# Patient Record
Sex: Male | Born: 1950 | Race: White | Hispanic: No | Smoking: Never smoker
Health system: Southern US, Community
[De-identification: ages and names within clinical notes are randomized; demographics above are authoritative.]

## PROBLEM LIST (undated history)

## (undated) DIAGNOSIS — C801 Malignant (primary) neoplasm, unspecified: Secondary | ICD-10-CM

## (undated) DIAGNOSIS — T7840XA Allergy, unspecified, initial encounter: Secondary | ICD-10-CM

## (undated) DIAGNOSIS — K219 Gastro-esophageal reflux disease without esophagitis: Secondary | ICD-10-CM

## (undated) HISTORY — PX: BASAL CELL CARCINOMA EXCISION: SHX1214

## (undated) HISTORY — DX: Malignant (primary) neoplasm, unspecified: C80.1

## (undated) HISTORY — PX: SQUAMOUS CELL CARCINOMA EXCISION: SHX2433

## (undated) HISTORY — PX: HERNIA REPAIR: SHX51

## (undated) HISTORY — DX: Gastro-esophageal reflux disease without esophagitis: K21.9

## (undated) HISTORY — PX: PTERYGIUM EXCISION: SHX2273

## (undated) HISTORY — PX: FRACTURE SURGERY: SHX138

## (undated) HISTORY — DX: Allergy, unspecified, initial encounter: T78.40XA

## (undated) HISTORY — PX: NO PAST SURGERIES: SHX2092

---

## 2018-05-14 ENCOUNTER — Ambulatory Visit: Payer: Self-pay | Admitting: Urology

## 2018-08-08 ENCOUNTER — Other Ambulatory Visit: Payer: Self-pay

## 2018-08-08 DIAGNOSIS — R972 Elevated prostate specific antigen [PSA]: Secondary | ICD-10-CM

## 2018-08-09 ENCOUNTER — Other Ambulatory Visit: Payer: Self-pay

## 2018-08-09 ENCOUNTER — Other Ambulatory Visit: Payer: Medicare Other

## 2018-08-09 DIAGNOSIS — R972 Elevated prostate specific antigen [PSA]: Secondary | ICD-10-CM

## 2018-08-10 LAB — PSA: Prostate Specific Ag, Serum: 4.3 ng/mL — ABNORMAL HIGH (ref 0.0–4.0)

## 2018-08-13 ENCOUNTER — Other Ambulatory Visit: Payer: Self-pay

## 2018-08-13 ENCOUNTER — Encounter: Payer: Self-pay | Admitting: Urology

## 2018-08-13 ENCOUNTER — Ambulatory Visit: Payer: Medicare Other | Admitting: Urology

## 2018-08-13 VITALS — BP 141/77 | HR 71 | Ht 73.0 in | Wt 232.0 lb

## 2018-08-13 DIAGNOSIS — R972 Elevated prostate specific antigen [PSA]: Secondary | ICD-10-CM | POA: Diagnosis not present

## 2018-08-13 NOTE — Progress Notes (Signed)
08/13/2018 8:49 PM   Juan West 09-04-1950 [id number]  Referring provider: Alvera Singh, [address] [place],  [place] [address]  Chief Complaint  Patient presents with  . Elevated PSA    HPI: 68 year old male referred for evaluation of elevated/rising PSA.  He underwent routine annual PSA screening on 04/04/2018 which time his PSA was noted to be 3.48.  This was up from the previous year on 02/09/2017 at which time his PSA was 2.56.  Overall, his PSA is relatively stable, 3.6 from 10/2013.  His PSA was repeated just prior to this appointment in continues to rise 4.3.   He denies any significant urinary symptoms.  He occasionally has some dysuria after holding it for a long time when traveling but this is rare and infrequent.  No family history of prostate cancer.  He reports that his father had prostate issues and is elderly years but unrelated to malignancy.  No weight loss or bone pain.  No history of urologic evaluation in the past.  PMH: History reviewed. No pertinent past medical history.  Surgical History: Past Surgical History:  Procedure Laterality Date  . NO PAST SURGERIES      Home Medications:  Allergies as of 08/13/2018      Reactions   Codeine Other (See Comments)   hallucinations      Medication List       Accurate as of August 13, 2018  8:49 PM. If you have any questions, ask your nurse or doctor.        aspirin EC 81 MG tablet Take by mouth.   Biotin 1 MG Caps Take by mouth.       Allergies:  Allergies  Allergen Reactions  . Codeine Other (See Comments)    hallucinations    Family History: History reviewed. No pertinent family history.  Social History:  reports that he has never smoked. He has never used smokeless tobacco. He reports that he does not drink alcohol. No history on file for drug.  ROS: UROLOGY Frequent Urination?: No Hard to postpone urination?: No Burning/pain with  urination?: No Get up at night to urinate?: No Leakage of urine?: No Urine stream starts and stops?: No Trouble starting stream?: No Do you have to strain to urinate?: No Blood in urine?: No Urinary tract infection?: No Sexually transmitted disease?: No Injury to kidneys or bladder?: No Painful intercourse?: No Weak stream?: No Erection problems?: No Penile pain?: No  Gastrointestinal Nausea?: No Vomiting?: No Indigestion/heartburn?: No Diarrhea?: No Constipation?: No  Constitutional Fever: No Night sweats?: No Weight loss?: No Fatigue?: No  Skin Skin rash/lesions?: No Itching?: No  Eyes Blurred vision?: No Double vision?: No  Ears/Nose/Throat Sore throat?: No Sinus problems?: No  Hematologic/Lymphatic Swollen glands?: No Easy bruising?: No  Cardiovascular Leg swelling?: No Chest pain?: No  Respiratory Cough?: No Shortness of breath?: No  Endocrine Excessive thirst?: No  Musculoskeletal Back pain?: No Joint pain?: No  Neurological Headaches?: No Dizziness?: No  Psychologic Depression?: No Anxiety?: No  Physical Exam: BP (!) 141/77   Pulse 71   Ht 6\' 1"  (1.854 m)   Wt 232 lb (105.2 kg)   BMI 30.61 kg/m   Constitutional:  Alert and oriented, No acute distress. HEENT:  AT, moist mucus membranes.  Trachea midline, no masses. Cardiovascular: No clubbing, cyanosis, or edema. Respiratory: Normal respiratory effort, no increased work of breathing. GI: Abdomen is soft, nontender, nondistended, no abdominal masses Rectal: Normal sphincter tone.  45 cc prostate, nontender, no nodules. Skin: No rashes, bruises or suspicious lesions. Neurologic: Grossly intact, no focal deficits, moving all 4 extremities. Psychiatric: Normal mood and affect.  Laboratory Data: Creatinine 1.1 on 03/2018, baseline appears to range from 1-1.3.   Assessment & Plan:    1. Elevated PSA  We reviewed the implications of an elevated PSA and the uncertainty  surrounding it. In general, a man's PSA increases with age and is produced by both normal and cancerous prostate tissue. Differential for elevated PSA is BPH, prostate cancer, infection, recent intercourse/ejaculation, prostate infarction, recent urethroscopic manipulation (foley placement/cystoscopy) and prostatitis. Management of an elevated PSA can include observation or prostate biopsy and wediscussed this in detail. We discussed that indications for prostate biopsy are defined by age and race specific PSA cutoffs as well as a PSA velocity of 0.75/year.  We discussed that if his overall trend and rising is somewhat worrisome.  That being said, his PSA has been as high as 3 3.65 years ago so his overall baseline is not significantly changed.  He was offered various options today including prostate biopsy, prostate MRI and continued close surveillance.  He is elected to proceed close surveillance.  He will follow-up in 3 months with repeat PSA.  If his PSA continues to rise, will more strongly push for the previous options.  He is agreeable this plan.  - PSA; Future   Return in 3 months (on 11/13/2018) for will call with PSA results and plan, MD follow up.  Hollice Espy, MD  [facility] [address], [place], [place] [address] [phone]

## 2018-11-12 ENCOUNTER — Other Ambulatory Visit: Payer: Medicare Other

## 2018-11-12 ENCOUNTER — Encounter: Payer: Self-pay | Admitting: Urology

## 2018-11-14 ENCOUNTER — Ambulatory Visit (INDEPENDENT_AMBULATORY_CARE_PROVIDER_SITE_OTHER): Payer: Medicare Other | Admitting: Urology

## 2018-11-14 ENCOUNTER — Encounter: Payer: Self-pay | Admitting: Urology

## 2018-11-14 ENCOUNTER — Other Ambulatory Visit: Payer: Self-pay

## 2018-11-14 DIAGNOSIS — R972 Elevated prostate specific antigen [PSA]: Secondary | ICD-10-CM

## 2018-11-14 NOTE — Progress Notes (Signed)
Patient arrived for clinic appointment today but did not have his PSA drawn prior to this appointment.  As such, his PSA was drawn and will have a follow-up tomorrow via virtual visit.  Hollice Espy, MD

## 2018-11-15 ENCOUNTER — Telehealth (INDEPENDENT_AMBULATORY_CARE_PROVIDER_SITE_OTHER): Payer: Medicare Other | Admitting: Urology

## 2018-11-15 DIAGNOSIS — R972 Elevated prostate specific antigen [PSA]: Secondary | ICD-10-CM

## 2018-11-15 LAB — PSA: Prostate Specific Ag, Serum: 3.7 ng/mL (ref 0.0–4.0)

## 2018-11-15 NOTE — Progress Notes (Signed)
Virtual Visit via Telephone Note  I connected with Juan West on 11/15/18 at 11:30 AM EDT by telephone and verified that I am speaking with the correct person using two identifiers.  Location: Patient: Home Provider: Office [facility])   I discussed the limitations, risks, security and privacy concerns of performing an evaluation and management service by telephone and the availability of in person appointments. I also discussed with the patient that there may be a patient responsible charge related to this service. The patient expressed understanding and agreed to proceed.   History of Present Illness: 68 year old male with minimal medical comorbidities who was initially referred for elevated PSA.  The time, his PSA had risen to 4.3 up from his previous baseline in the mid threes.  Rectal exam was previously unremarkable in 07/2018 with a 45 g prostate, no nodules.  He has no symptoms.  Repeat PSA shows return of his PSA back to his previous baseline, 3.7.    Observations/Objective: Pleasant, interactive  Assessment and Plan:  1. Elevated PSA Personal history of some PSA fluctuation, most recent PSA back to his previous baseline  We discussed the PSA screening guidelines, age 30 or perhaps longer depending on the patient's comorbidities.  Given these reasonably healthy, would recommend continuation of screening at least until the age of 25 with annual PSA/DRE.  He may be discharged back to his primary care, refer back as needed.  All questions answered today.    I discussed the assessment and treatment plan with the patient. The patient was provided an opportunity to ask questions and all were answered. The patient agreed with the plan and demonstrated an understanding of the instructions.   The patient was advised to call back or seek an in-person evaluation if the symptoms worsen or if the condition fails to improve as anticipated.  I provided 6 minutes of  non-face-to-face time during this encounter.   Hollice Espy, MD

## 2020-05-19 LAB — BASIC METABOLIC PANEL
BUN: 19 (ref 4–21)
CO2: 22 (ref 13–22)
Chloride: 102 (ref 99–108)
Creatinine: 1.4 — AB (ref 0.6–1.3)
Glucose: 95
Potassium: 4.7 (ref 3.4–5.3)
Sodium: 143 (ref 137–147)

## 2020-05-19 LAB — HEPATIC FUNCTION PANEL
ALT: 29 (ref 10–40)
AST: 28 (ref 14–40)
Alkaline Phosphatase: 71 (ref 25–125)
Bilirubin, Total: 0.6

## 2020-05-19 LAB — COMPREHENSIVE METABOLIC PANEL
Albumin: 4.8 (ref 3.5–5.0)
Calcium: 9.4 (ref 8.7–10.7)
GFR calc non Af Amer: 56
Globulin: 2.4

## 2020-05-19 LAB — LIPID PANEL
Cholesterol: 144 (ref 0–200)
HDL: 32 — AB (ref 35–70)
LDL Cholesterol: 93
LDl/HDL Ratio: 2.9
Triglycerides: 102 (ref 40–160)

## 2020-05-25 ENCOUNTER — Other Ambulatory Visit: Payer: Self-pay | Admitting: Family Medicine

## 2020-05-25 DIAGNOSIS — Z823 Family history of stroke: Secondary | ICD-10-CM

## 2020-05-25 DIAGNOSIS — R030 Elevated blood-pressure reading, without diagnosis of hypertension: Secondary | ICD-10-CM

## 2020-07-05 ENCOUNTER — Ambulatory Visit
Admission: RE | Admit: 2020-07-05 | Discharge: 2020-07-05 | Disposition: A | Discharge status: Home / Self Care | Payer: Medicare Other | Source: Ambulatory Visit | Attending: Family Medicine | Admitting: Family Medicine

## 2020-07-05 ENCOUNTER — Other Ambulatory Visit: Payer: Self-pay

## 2020-07-05 DIAGNOSIS — I6522 Occlusion and stenosis of left carotid artery: Secondary | ICD-10-CM | POA: Diagnosis not present

## 2020-07-05 DIAGNOSIS — Z823 Family history of stroke: Secondary | ICD-10-CM | POA: Diagnosis not present

## 2020-07-05 DIAGNOSIS — R030 Elevated blood-pressure reading, without diagnosis of hypertension: Secondary | ICD-10-CM | POA: Diagnosis not present

## 2020-07-08 ENCOUNTER — Encounter: Payer: Self-pay | Admitting: Nurse Practitioner

## 2020-07-08 ENCOUNTER — Other Ambulatory Visit: Payer: Self-pay

## 2020-07-08 ENCOUNTER — Ambulatory Visit (INDEPENDENT_AMBULATORY_CARE_PROVIDER_SITE_OTHER): Payer: Medicare Other | Admitting: Nurse Practitioner

## 2020-07-08 VITALS — BP 141/83 | HR 77 | Temp 98.2°F | Ht 71.8 in | Wt 249.6 lb

## 2020-07-08 DIAGNOSIS — R0609 Other forms of dyspnea: Secondary | ICD-10-CM | POA: Insufficient documentation

## 2020-07-08 DIAGNOSIS — R0600 Dyspnea, unspecified: Secondary | ICD-10-CM | POA: Diagnosis not present

## 2020-07-08 DIAGNOSIS — R030 Elevated blood-pressure reading, without diagnosis of hypertension: Secondary | ICD-10-CM | POA: Diagnosis not present

## 2020-07-08 DIAGNOSIS — R7989 Other specified abnormal findings of blood chemistry: Secondary | ICD-10-CM | POA: Insufficient documentation

## 2020-07-08 DIAGNOSIS — K219 Gastro-esophageal reflux disease without esophagitis: Secondary | ICD-10-CM | POA: Diagnosis not present

## 2020-07-08 DIAGNOSIS — Z7689 Persons encountering health services in other specified circumstances: Secondary | ICD-10-CM

## 2020-07-08 NOTE — Assessment & Plan Note (Signed)
Chronic, stable. Controlled with omeprazole OTC. Continue current regimen. Follow up with any concerns.

## 2020-07-08 NOTE — Assessment & Plan Note (Signed)
Chronic, over the last 3 years, however getting progressively worse. He had an abnormal stress test in 2019, however his cardiac catheterization was normal. He had carotid dopplers which were normal. He said he had a breathing test which was normal, however he said he was not having to exert himself. Chest x-ray was normal in 2019. He used an albuterol inhaler at one point, however he did not notice any differences, so he stopped using it. Labs reviewed from May 19, 2020. Will order echo and refer to pulmonology. Follow-up in 3 months or sooner with concerns.

## 2020-07-08 NOTE — Assessment & Plan Note (Signed)
Blood pressure slightly elevated today at 141/83. Discussed diet, exercise, and weight loss. He is currently active, however unable to exert too hard due to shortness of breath.

## 2020-07-08 NOTE — Progress Notes (Addendum)
New Patient Office Visit  Subjective:  Patient ID: Juan West, male    DOB: Dec 12, 1950  Age: 70 y.o. MRN: [id number]  CC:  Chief Complaint  Patient presents with   Establish Care    Pt states he would like to discuss SOB. States he has been getting very SOB lately.      HPI Juan West presents for new patient visit to establish care.  Introduced to Designer, jewellery role and practice setting.  All questions answered.  Discussed provider/patient relationship and expectations.  SHORTNESS OF BREATH  Duration: year,however progressively getting worse Onset: gradual Description of breathing discomfort: severe shortness of breath with activity Severity: severe Episode duration: 5 minutes to recover after activity Frequency: with exertion Related to exertion: yes Cough: no Chest tightness: yes Wheezing:  unsure Fevers: no Chest pain: no Palpitations: yes  Nausea: no Diaphoresis: no Deconditioning: no Status: worse Aggravating factors: exercise, bending over Alleviating factors: rest Treatments attempted: albuterol--didn't help much  He is normally an active person. He goes to [employer] walks 3.64mph on the treadmill with no issues. He has shortness of breath with more intense activity and bending over. He had a job recently where he had to climb 120 steps, and it took him over 30 minutes to do this with multiple breaks needed. Of note, he did have covid-19 a year ago.    Past Medical History:  Diagnosis Date   Allergy    Cancer ([place])    GERD (gastroesophageal reflux disease)     Past Surgical History:  Procedure Laterality Date   BASAL CELL CARCINOMA EXCISION     HERNIA REPAIR     NO PAST SURGERIES     PTERYGIUM EXCISION     SQUAMOUS CELL CARCINOMA EXCISION      Family History  Problem Relation Age of Onset   Stroke Mother    Cancer Father    Stroke Brother    Autism Granddaughter     Social History   Socioeconomic History   Marital  status: Divorced    Spouse name: Not on file   Number of children: Not on file   Years of education: Not on file   Highest education level: Not on file  Occupational History   Not on file  Tobacco Use   Smoking status: Never   Smokeless tobacco: Never  Vaping Use   Vaping Use: Never used  Substance and Sexual Activity   Alcohol use: Yes    Comment: on occasion   Drug use: Never   Sexual activity: Not Currently  Other Topics Concern   Not on file  Social History Narrative   Not on file   Social Determinants of Health   Financial Resource Strain: Not on file  Food Insecurity: Not on file  Transportation Needs: Not on file  Physical Activity: Not on file  Stress: Not on file  Social Connections: Not on file  Intimate Partner Violence: Not on file    ROS Review of Systems  Constitutional:  Positive for fatigue. Negative for fever.  HENT:  Positive for hearing loss (left ear).   Eyes: Negative.   Respiratory:  Positive for chest tightness and shortness of breath (with exertion). Negative for cough.   Cardiovascular: Negative.   Gastrointestinal: Negative.   Endocrine: Negative.   Genitourinary: Negative.   Musculoskeletal: Negative.   Skin: Negative.   Allergic/Immunologic: Positive for environmental allergies.  Neurological:  Positive for dizziness (intermittent). Negative for weakness and numbness.  Psychiatric/Behavioral:  Positive for sleep disturbance.    Objective:   Today's Vitals: BP (!) 141/83 (BP Location: Right Arm, Cuff Size: Normal)   Pulse 77   Temp 98.2 F (36.8 C) (Oral)   Ht 5' 11.8" (1.824 m)   Wt 249 lb 9.6 oz (113.2 kg)   SpO2 99%   BMI 34.04 kg/m   Physical Exam Vitals and nursing note reviewed.  Constitutional:      General: He is not in acute distress.    Appearance: Normal appearance. He is obese.  HENT:     Head: Normocephalic.  Eyes:     Conjunctiva/sclera: Conjunctivae normal.  Cardiovascular:     Rate and Rhythm: Normal  rate and regular rhythm.     Pulses: Normal pulses.     Heart sounds: Normal heart sounds.  Pulmonary:     Effort: Pulmonary effort is normal.     Breath sounds: Normal breath sounds.  Abdominal:     General: Bowel sounds are normal.     Palpations: Abdomen is soft.     Tenderness: no abdominal tenderness  Musculoskeletal:        General: Normal range of motion.     Cervical back: Normal range of motion.     Right lower leg: No edema.     Left lower leg: No edema.  Skin:    General: Skin is warm.  Neurological:     General: No focal deficit present.     Mental Status: He is alert and oriented to person, place, and time.  Psychiatric:        Mood and Affect: Mood normal.        Behavior: Behavior normal.        Thought Content: Thought content normal.        Judgment: Judgment normal.    Assessment & Plan:   Problem List Items Addressed This Visit       Digestive   Gastroesophageal reflux disease    Chronic, stable. Controlled with omeprazole OTC. Continue current regimen. Follow up with any concerns.       Relevant Medications   omeprazole (PRILOSEC) 10 MG capsule     Other   Dyspnea on exertion - Primary    Chronic, over the last 3 years, however getting progressively worse. He had an abnormal stress test in 2019, however his cardiac catheterization was normal. He had carotid dopplers which were normal. He said he had a breathing test which was normal, however he said he was not having to exert himself. Chest x-ray was normal in 2019. He used an albuterol inhaler at one point, however he did not notice any differences, so he stopped using it. Labs reviewed from May 19, 2020. Will order echo and refer to pulmonology. Follow-up in 3 months or sooner with concerns.        Relevant Orders   ECHOCARDIOGRAM COMPLETE   Ambulatory referral to Pulmonology   Elevated blood pressure reading    Blood pressure slightly elevated today at 141/83. Discussed diet, exercise, and  weight loss. He is currently active, however unable to exert too hard due to shortness of breath.        Elevated serum creatinine    Creatinine from April 2022 was slightly elevated at 1.4. Encouraged him to drink plenty of fluids. Will recheck at his next visit.        Other Visit Diagnoses     Encounter to establish care  Outpatient Encounter Medications as of 07/08/2020  Medication Sig   omeprazole (PRILOSEC) 10 MG capsule Take 10 mg by mouth daily.   [DISCONTINUED] aspirin EC 81 MG tablet Take by mouth.   [DISCONTINUED] Biotin 1 MG CAPS Take by mouth.   [DISCONTINUED] omeprazole (PRILOSEC) 20 MG capsule Take 1 capsule by mouth daily.   No facility-administered encounter medications on file as of 07/08/2020.    Follow-up: Return in about 3 months (around 10/08/2020) for shortness of breath.   Charyl Dancer, NP

## 2020-07-08 NOTE — Assessment & Plan Note (Signed)
Creatinine from April 2022 was slightly elevated at 1.4. Encouraged him to drink plenty of fluids. Will recheck at his next visit.

## 2020-07-28 ENCOUNTER — Ambulatory Visit
Admission: RE | Admit: 2020-07-28 | Discharge: 2020-07-28 | Disposition: A | Discharge status: Home / Self Care | Payer: Medicare Other | Source: Ambulatory Visit | Attending: Family Medicine | Admitting: Family Medicine

## 2020-07-28 DIAGNOSIS — R030 Elevated blood-pressure reading, without diagnosis of hypertension: Secondary | ICD-10-CM

## 2020-07-29 NOTE — Telephone Encounter (Signed)
Called [facility] they said they faxed over records on 6/22 they will fax again

## 2020-08-09 ENCOUNTER — Ambulatory Visit (INDEPENDENT_AMBULATORY_CARE_PROVIDER_SITE_OTHER): Payer: Medicare Other | Admitting: Internal Medicine

## 2020-08-09 ENCOUNTER — Ambulatory Visit
Admission: RE | Admit: 2020-08-09 | Discharge: 2020-08-09 | Disposition: A | Discharge status: Home / Self Care | Payer: Medicare Other | Attending: Internal Medicine | Admitting: Internal Medicine

## 2020-08-09 ENCOUNTER — Encounter: Payer: Self-pay | Admitting: Internal Medicine

## 2020-08-09 ENCOUNTER — Other Ambulatory Visit: Payer: Self-pay

## 2020-08-09 ENCOUNTER — Ambulatory Visit
Admission: RE | Admit: 2020-08-09 | Discharge: 2020-08-09 | Disposition: A | Discharge status: Home / Self Care | Payer: Medicare Other | Source: Ambulatory Visit | Attending: Internal Medicine | Admitting: Internal Medicine

## 2020-08-09 VITALS — BP 110/80 | HR 79 | Temp 97.9°F | Ht 72.0 in | Wt 248.4 lb

## 2020-08-09 DIAGNOSIS — T7589XA Other specified effects of external causes, initial encounter: Secondary | ICD-10-CM

## 2020-08-09 DIAGNOSIS — R0602 Shortness of breath: Secondary | ICD-10-CM

## 2020-08-09 NOTE — Patient Instructions (Signed)
Obtain CXR Obtain PFT's Obtain 6MWT and OVERNIGHT PULSE OXIMETRY

## 2020-08-09 NOTE — Progress Notes (Signed)
[facility] Consultation      Date: 08/09/2020,   MRN# [id number] Juan West [birth year]     Admission                  Current  Juan West is a 70 y.o. old male seen in consultation for SOB at the request of [place].     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 , paint, dye houses, warehouses for many years  Patient states that he had extensive cardiac work up 4 years ago to address SOB Patient has occasional wheezing witnessed by family  No evidence of heart failure at this time No evidence or signs of infection at this time No respiratory distress No fevers, chills, nausea, vomiting, diarrhea No evidence of lower extremity edema No evidence hemoptysis  Patient diagnosed with COVID infection last year sometime, diagnosed with a home test Was not hospitalized Breathing was not affected   I explained to patient that we need further testing to assess for breathing and lung issues  Patient weighed 230 pounds 10 years ago Patient now weighs approx 250 pounds 20 pound weight gain in 10 years  PAST MEDICAL HISTORY   Past Medical History:  Diagnosis Date   Allergy    Cancer ([place])    GERD (gastroesophageal reflux disease)      SURGICAL HISTORY   Past Surgical History:  Procedure Laterality Date   BASAL CELL CARCINOMA EXCISION     HERNIA REPAIR     NO PAST SURGERIES     PTERYGIUM EXCISION     SQUAMOUS CELL CARCINOMA EXCISION       FAMILY HISTORY   Family History  Problem Relation Age of Onset   Stroke Mother    Cancer Father    Stroke Brother    Autism Granddaughter      SOCIAL HISTORY   Social History   Tobacco Use   Smoking status: Never   Smokeless  tobacco: Never  Vaping Use   Vaping Use: Never used  Substance Use Topics   Alcohol use: Yes    Comment: on occasion   Drug use: Never     MEDICATIONS    Home Medication:  Current Outpatient Rx   Order #: [id number] Class: Historical Med    Current Medication:  Current Outpatient Medications:    omeprazole (PRILOSEC) 10 MG capsule, Take 10 mg by mouth daily., Disp: , Rfl:     ALLERGIES   Codeine, Melatonin, and Trazodone and nefazodone     REVIEW OF SYSTEMS    Review of Systems:  Gen:  Denies  fever, sweats, chills weight loss  HEENT: Denies blurred vision, double vision, ear pain, eye pain, hearing loss, nose bleeds, sore throat Cardiac:  No dizziness, chest pain or heaviness, chest tightness,edema, No JVD Resp:   No cough, -sputum production, +shortness of breath,+wheezing, -hemoptysis,  Gi: Denies swallowing difficulty, stomach pain, nausea or vomiting, diarrhea, constipation, bowel incontinence Gu:  Denies bladder incontinence, burning urine Ext:   Denies Joint pain, stiffness or swelling Skin: Denies  skin rash, easy bruising or bleeding or hives Endoc:  Denies polyuria, polydipsia , polyphagia or weight change Psych:   Denies depression, insomnia or  hallucinations  Other:  All other systems negative   Weight 248 11/80 98% RA HR 79  Physical Examination:   General Appearance: No distress  EYES PERRLA, EOM intact.   NECK Supple, No JVD Pulmonary: normal breath sounds, No wheezing.  CardiovascularNormal S1,S2.  No m/r/g.   Abdomen: Benign, Soft, non-tender. Skin:   warm, no rashes, no ecchymosis  Extremities: normal, no cyanosis, clubbing. Neuro:without focal findings,  speech normal  PSYCHIATRIC: Mood, affect within normal limits.   ALL OTHER ROS ARE NEGATIVE      IMAGING    US AORTA MEDICARE SCREENING  Result Date: 07/28/2020 CLINICAL DATA:  Male between 28-28 years of age with a smoking history. History of hypertension. EXAM: US ABDOMINAL  AORTA MEDICARE SCREENING TECHNIQUE: Ultrasound examination of the abdominal aorta was performed as a screening evaluation for abdominal aortic aneurysm. COMPARISON:  None. FINDINGS: Abdominal aortic measurements as follows: Proximal:  2.7 x 2.9 cm Mid:  2.1 x 2.2 cm Distal:  1.9 x 2.0 cm IMPRESSION: No evidence of abdominal aortic aneurysm. Electronically Signed   By: Aletta Edouard M.D.   On: 07/28/2020 12:16      ASSESSMENT/PLAN   70 yo WM with progressive SOB and increased WOB with occasional wheezing likely related to chronic environmental exposure from his work causing intermittent reactive airways disease previous diagnosis of COVID infection with obesity   I have explained that I will need further testing Will need PFTs and assess exertional and nocturnal hypoxia   Obesity -recommend significant weight loss -recommend changing diet  Deconditioned state -Recommend increased daily activity and exercise   MEDICATION ADJUSTMENTS/LABS AND TESTS ORDERED: Obtain CXR Obtain PFT's Obtain 6MWT and OVERNIGHT PULSE OXIMETRY   CURRENT MEDICATIONS REVIEWED AT LENGTH WITH PATIENT TODAY   Patient  satisfied with Plan of action and management. All questions answered  Follow up 3 months  Total Time Spent  55 mins   Corrin Parker, M.D.  [facility]  Medical Director [place] Director [facility] Cardio-P[facility] Department

## 2020-08-10 ENCOUNTER — Encounter: Payer: Self-pay | Admitting: Nurse Practitioner

## 2020-08-10 ENCOUNTER — Ambulatory Visit (INDEPENDENT_AMBULATORY_CARE_PROVIDER_SITE_OTHER): Payer: Medicare Other | Admitting: Nurse Practitioner

## 2020-08-10 VITALS — BP 149/85 | HR 74 | Temp 98.1°F | Wt 249.0 lb

## 2020-08-10 DIAGNOSIS — R0609 Other forms of dyspnea: Secondary | ICD-10-CM

## 2020-08-10 DIAGNOSIS — R0600 Dyspnea, unspecified: Secondary | ICD-10-CM

## 2020-08-10 DIAGNOSIS — R42 Dizziness and giddiness: Secondary | ICD-10-CM | POA: Diagnosis not present

## 2020-08-10 DIAGNOSIS — R030 Elevated blood-pressure reading, without diagnosis of hypertension: Secondary | ICD-10-CM | POA: Diagnosis not present

## 2020-08-10 NOTE — Assessment & Plan Note (Addendum)
With positive Epley maneuver and dizziness with changes in position, suspect benign positional vertigo. Encouraged him to drink fluids and giving exercises to help with vertigo. If symptoms still continue, can try vestibular PT and may need imaging. Check CMP and CBC today. Follow-up in 4 weeks or sooner if symptoms worsen.

## 2020-08-10 NOTE — Assessment & Plan Note (Signed)
Blood pressure elevated again at today's visit. He says he does get anxious at doctor appointments. Encouraged him to get a BP cuff and monitor his blood pressure at home daily and write down the results and bring to his next visit. Will follow-up in 4 weeks.

## 2020-08-10 NOTE — Patient Instructions (Signed)
Start checking your blood pressure once a day at home and write it down. Check it after you have been sitting for 10 minutes and not had any caffeine.

## 2020-08-10 NOTE — Assessment & Plan Note (Signed)
Chronic, ongoing. Reviewed pulmonologist's note. Will await test results and collaborate with care. Reviewed prior labs from last PCP. Will recheck CMP, CBC, and TSH.

## 2020-08-10 NOTE — Progress Notes (Signed)
Established Patient Office Visit  Subjective:  Patient ID: Juan West, male    DOB: [birth year]  Age: 70 y.o. MRN: [id number]  CC:  Chief Complaint  Patient presents with   Dizziness    Patient states he has been dizzy for a few days, comes and goes    Shortness of Breath    Patient states he is still having shortness of breath, states about the same as last time     HPI Juan West presents for dizziness and shortness of breath with exertion  DIZZINESS  Duration: over 1 weeks Description of symptoms: off kilter, feels like a wave comes over him and then fades away Duration of episode: seconds Dizziness frequency: no history of the same Provoking factors: none Aggravating factors:  none Triggered by rolling over in bed:  not sure Triggered by bending over: no Aggravated by head movement: no Aggravated by exertion, coughing, loud noises: no Recent head injury: no Recent or current viral symptoms: yes runny nose for 2 days 1 week before History of vasovagal episodes: no Nausea: no Vomiting: no Tinnitus: yes Hearing loss: yes left ear, chronic Aural fullness: no Headache: no Photophobia/phonophobia: no Unsteady gait: no Postural instability: no Diplopia, dysarthria, dysphagia or weakness: no Related to exertion: no Pallor: no Diaphoresis: yes Dyspnea: yes, chronic Chest pain: no  SHORTNESS OF BREATH  His symptoms are the same as they were last visit. He can do normal activity, walk on the treadmill with no shortness of breath. If he suddenly exerts himself or is carrying/pushing a heavy object, he gets short of breath easily. He saw pulmonology the other day, who ordered an x-ray, pulmonary function tests and a sleep study. He also stated that the pulmonologist said it could be from his weight and deconditioning. He denies chest pain.   Past Medical History:  Diagnosis Date   Allergy    Cancer ([place])    GERD (gastroesophageal reflux disease)      Past Surgical History:  Procedure Laterality Date   BASAL CELL CARCINOMA EXCISION     HERNIA REPAIR     NO PAST SURGERIES     PTERYGIUM EXCISION     SQUAMOUS CELL CARCINOMA EXCISION      Family History  Problem Relation Age of Onset   Stroke Mother    Cancer Father    Stroke Brother    Autism Granddaughter     Social History   Socioeconomic History   Marital status: Divorced    Spouse name: Not on file   Number of children: Not on file   Years of education: Not on file   Highest education level: Not on file  Occupational History   Not on file  Tobacco Use   Smoking status: Never   Smokeless tobacco: Never  Vaping Use   Vaping Use: Never used  Substance and Sexual Activity   Alcohol use: Yes    Comment: on occasion   Drug use: Never   Sexual activity: Not Currently  Other Topics Concern   Not on file  Social History Narrative   Not on file   Social Determinants of Health   Financial Resource Strain: Not on file  Food Insecurity: Not on file  Transportation Needs: Not on file  Physical Activity: Not on file  Stress: Not on file  Social Connections: Not on file  Intimate Partner Violence: Not on file    Outpatient Medications Prior to Visit  Medication Sig Dispense Refill   omeprazole (  PRILOSEC) 10 MG capsule Take 10 mg by mouth daily.     No facility-administered medications prior to visit.    Allergies  Allergen Reactions   Codeine Other (See Comments)    hallucinations   Melatonin Other (See Comments)    Drowsiness    Trazodone And Nefazodone Nausea And Vomiting    ROS Review of Systems  Constitutional: Negative.   HENT: Negative.    Respiratory:  Positive for shortness of breath (with heavy exertion).   Cardiovascular: Negative.   Gastrointestinal: Negative.   Skin: Negative.   Neurological:  Positive for dizziness.     Objective:    Physical Exam Vitals and nursing note reviewed.  Constitutional:      Appearance: Normal  appearance.  HENT:     Head: Normocephalic.     Right Ear: Tympanic membrane, ear canal and external ear normal.     Left Ear: Tympanic membrane, ear canal and external ear normal.  Eyes:     Conjunctiva/sclera: Conjunctivae normal.  Cardiovascular:     Rate and Rhythm: Normal rate and regular rhythm.     Pulses: Normal pulses.     Heart sounds: Normal heart sounds.  Pulmonary:     Effort: Pulmonary effort is normal.     Breath sounds: Normal breath sounds.  Musculoskeletal:     Cervical back: Normal range of motion.  Skin:    General: Skin is warm and dry.  Neurological:     General: No focal deficit present.     Mental Status: He is alert and oriented to person, place, and time.     Motor: No weakness.     Coordination: Coordination normal.     Gait: Gait normal.     Comments: Dizziness with epley maneuver bilaterally and when going from a laying to sitting position.   Psychiatric:        Mood and Affect: Mood normal.        Behavior: Behavior normal.        Thought Content: Thought content normal.        Judgment: Judgment normal.    BP (!) 149/85   Pulse 74   Temp 98.1 F (36.7 C)   Wt 249 lb (112.9 kg)   SpO2 96%   BMI 33.77 kg/m  Wt Readings from Last 3 Encounters:  08/10/20 249 lb (112.9 kg)  08/09/20 248 lb 6.4 oz (112.7 kg)  07/08/20 249 lb 9.6 oz (113.2 kg)   Orthostatic vital signs:  Laying: BP 149/92 HR 64 Sitting: BP 153/90 HR 71 Standing: BP 153/78 HR 73  Health Maintenance Due  Topic Date Due   Hepatitis C Screening  Never done   TETANUS/TDAP  Never done   COLONOSCOPY (Pts 45-62yr Insurance coverage will need to be confirmed)  Never done   Zoster Vaccines- Shingrix (1 of 2) Never done    There are no preventive care reminders to display for this patient.  No results found for: TSH No results found for: WBC, HGB, HCT, MCV, PLT Lab Results  Component Value Date   NA 143 05/19/2020   K 4.7 05/19/2020   CO2 22 05/19/2020   BUN 19  05/19/2020   CREATININE 1.4 (A) 05/19/2020   ALKPHOS 71 05/19/2020   AST 28 05/19/2020   ALT 29 05/19/2020   ALBUMIN 4.8 05/19/2020   CALCIUM 9.4 05/19/2020   Lab Results  Component Value Date   CHOL 144 05/19/2020   Lab Results  Component Value Date  HDL 32 (A) 05/19/2020   Lab Results  Component Value Date   LDLCALC 93 05/19/2020   Lab Results  Component Value Date   TRIG 102 05/19/2020   No results found for: CHOLHDL No results found for: HGBA1C    Assessment & Plan:   Problem List Items Addressed This Visit       Other   Dyspnea on exertion    Chronic, ongoing. Reviewed pulmonologist's note. Will await test results and collaborate with care. Reviewed prior labs from last PCP. Will recheck CMP, CBC, and TSH.        Relevant Orders   TSH   Elevated blood pressure reading    Blood pressure elevated again at today's visit. He says he does get anxious at doctor appointments. Encouraged him to get a BP cuff and monitor his blood pressure at home daily and write down the results and bring to his next visit. Will follow-up in 4 weeks.        Dizziness - Primary    With positive Epley maneuver and dizziness with changes in position, suspect benign positional vertigo. Encouraged him to drink fluids and giving exercises to help with vertigo. If symptoms still continue, can try vestibular PT and may need imaging. Check CMP and CBC today. Follow-up in 4 weeks or sooner if symptoms worsen.        Relevant Orders   Comp Met (CMET)   CBC with Differential    No orders of the defined types were placed in this encounter.   Follow-up: Return in about 4 weeks (around 09/07/2020) for dizziness, shortness of breath with exertion.    Charyl Dancer, NP

## 2020-08-11 LAB — COMPREHENSIVE METABOLIC PANEL
ALT: 24 IU/L (ref 0–44)
AST: 20 IU/L (ref 0–40)
Albumin/Globulin Ratio: 2.2 (ref 1.2–2.2)
Albumin: 4.6 g/dL (ref 3.8–4.8)
Alkaline Phosphatase: 69 IU/L (ref 44–121)
BUN/Creatinine Ratio: 11 (ref 10–24)
BUN: 14 mg/dL (ref 8–27)
Bilirubin Total: 0.4 mg/dL (ref 0.0–1.2)
CO2: 22 mmol/L (ref 20–29)
Calcium: 9.2 mg/dL (ref 8.6–10.2)
Chloride: 103 mmol/L (ref 96–106)
Creatinine, Ser: 1.23 mg/dL (ref 0.76–1.27)
Globulin, Total: 2.1 g/dL (ref 1.5–4.5)
Glucose: 92 mg/dL (ref 65–99)
Potassium: 4.5 mmol/L (ref 3.5–5.2)
Sodium: 139 mmol/L (ref 134–144)
Total Protein: 6.7 g/dL (ref 6.0–8.5)
eGFR: 63 mL/min/{1.73_m2} (ref >59)

## 2020-08-11 LAB — CBC WITH DIFFERENTIAL/PLATELET
Basophils Absolute: 0.1 10*3/uL (ref 0.0–0.2)
Basos: 2 %
EOS (ABSOLUTE): 0.2 10*3/uL (ref 0.0–0.4)
Eos: 4 %
Hematocrit: 50.7 % (ref 37.5–51.0)
Hemoglobin: 16.5 g/dL (ref 13.0–17.7)
Immature Grans (Abs): 0 10*3/uL (ref 0.0–0.1)
Immature Granulocytes: 0 %
Lymphocytes Absolute: 1.4 10*3/uL (ref 0.7–3.1)
Lymphs: 25 %
MCH: 29.1 pg (ref 26.6–33.0)
MCHC: 32.5 g/dL (ref 31.5–35.7)
MCV: 89 fL (ref 79–97)
Monocytes Absolute: 0.6 10*3/uL (ref 0.1–0.9)
Monocytes: 11 %
Neutrophils Absolute: 3.3 10*3/uL (ref 1.4–7.0)
Neutrophils: 58 %
Platelets: 248 10*3/uL (ref 150–450)
RBC: 5.67 x10E6/uL (ref 4.14–5.80)
RDW: 12.9 % (ref 11.6–15.4)
WBC: 5.6 10*3/uL (ref 3.4–10.8)

## 2020-08-11 LAB — TSH: TSH: 1.87 u[IU]/mL (ref 0.450–4.500)

## 2020-08-19 ENCOUNTER — Other Ambulatory Visit: Payer: Self-pay

## 2020-08-19 ENCOUNTER — Ambulatory Visit (INDEPENDENT_AMBULATORY_CARE_PROVIDER_SITE_OTHER): Payer: Medicare Other

## 2020-08-19 DIAGNOSIS — R0609 Other forms of dyspnea: Secondary | ICD-10-CM

## 2020-08-19 DIAGNOSIS — R0600 Dyspnea, unspecified: Secondary | ICD-10-CM | POA: Diagnosis not present

## 2020-08-19 DIAGNOSIS — R0602 Shortness of breath: Secondary | ICD-10-CM

## 2020-09-06 ENCOUNTER — Other Ambulatory Visit: Payer: Self-pay

## 2020-09-06 ENCOUNTER — Ambulatory Visit
Admission: RE | Admit: 2020-09-06 | Discharge: 2020-09-06 | Disposition: A | Discharge status: Home / Self Care | Payer: Medicare Other | Source: Ambulatory Visit | Attending: Nurse Practitioner | Admitting: Nurse Practitioner

## 2020-09-06 DIAGNOSIS — K219 Gastro-esophageal reflux disease without esophagitis: Secondary | ICD-10-CM | POA: Diagnosis not present

## 2020-09-06 DIAGNOSIS — I351 Nonrheumatic aortic (valve) insufficiency: Secondary | ICD-10-CM | POA: Diagnosis not present

## 2020-09-06 DIAGNOSIS — R0600 Dyspnea, unspecified: Secondary | ICD-10-CM | POA: Diagnosis present

## 2020-09-06 DIAGNOSIS — R0609 Other forms of dyspnea: Secondary | ICD-10-CM

## 2020-09-06 LAB — ECHOCARDIOGRAM COMPLETE
AR max vel: 2.05 cm2
AV Area VTI: 2.35 cm2
AV Area mean vel: 2.02 cm2
AV Mean grad: 2 mmHg
AV Peak grad: 4.2 mmHg
Ao pk vel: 1.02 m/s
Area-P 1/2: 3.34 cm2
S' Lateral: 3.2 cm

## 2020-09-06 NOTE — Progress Notes (Signed)
*  PRELIMINARY RESULTS* Echocardiogram 2D Echocardiogram has been performed.  Sherrie Sport 09/06/2020, 12:16 PM

## 2020-09-13 ENCOUNTER — Ambulatory Visit (INDEPENDENT_AMBULATORY_CARE_PROVIDER_SITE_OTHER): Payer: Medicare Other | Admitting: Nurse Practitioner

## 2020-09-13 ENCOUNTER — Encounter: Payer: Self-pay | Admitting: Nurse Practitioner

## 2020-09-13 ENCOUNTER — Other Ambulatory Visit: Payer: Self-pay

## 2020-09-13 VITALS — BP 145/82 | HR 65 | Temp 97.9°F | Ht 72.05 in | Wt 244.2 lb

## 2020-09-13 DIAGNOSIS — I5189 Other ill-defined heart diseases: Secondary | ICD-10-CM | POA: Insufficient documentation

## 2020-09-13 DIAGNOSIS — R0609 Other forms of dyspnea: Secondary | ICD-10-CM

## 2020-09-13 DIAGNOSIS — R229 Localized swelling, mass and lump, unspecified: Secondary | ICD-10-CM | POA: Diagnosis not present

## 2020-09-13 DIAGNOSIS — R0600 Dyspnea, unspecified: Secondary | ICD-10-CM

## 2020-09-13 DIAGNOSIS — I10 Essential (primary) hypertension: Secondary | ICD-10-CM

## 2020-09-13 MED ORDER — LISINOPRIL-HYDROCHLOROTHIAZIDE 10-12.5 MG PO TABS: 1.0000 | ORAL_TABLET | Freq: Every day | ORAL | 1 refills | Status: DC

## 2020-09-13 NOTE — Assessment & Plan Note (Signed)
Echocardiogram on 09/06/20 showed normal EF and grade I diastolic dysfunction. Will start lisinopril-hctz. Will place referral to cardiology for evaluation and recommendations.

## 2020-09-13 NOTE — Assessment & Plan Note (Signed)
Noted on echocardiogram 09/06/20. Will start lisinopril-hctz and refer to cardiology.

## 2020-09-13 NOTE — Progress Notes (Signed)
Established Patient Office Visit  Subjective:  Patient ID: Juan West, male    DOB: 1950-02-24  Age: 70 y.o. MRN: [id number]  CC:  Chief Complaint  Patient presents with   Dizziness    Has gone away    sob with exertion    Still going on    HPI Juan West presents for follow up on shortness of breath with exertion and dizziness. He states that his dizziness has mostly resolved with the epley maneuver. His shortness of breath is still ongoing with exertion. He can walk 3 miles on a treadmill and not get short of breath, however sudden exertion makes him stop to catch his breath. He had his echocardiogram 09/06/20. He said that when he was hooked up to the EKG leads, his heart rate was in the 40s while standing, then he layed down and it went to normal range. After he completed the echocardiogram, he did some jumping jacks while on the heart monitor and his heart rate went up into the 200s. When he stopped and rested, it came back down to normal range. He denies chest pain, fevers, and cough.   He has also been monitoring his blood pressure at home. The readings ranged from high 130s-150s/80s.   Past Medical History:  Diagnosis Date   Allergy    Cancer ([place])    GERD (gastroesophageal reflux disease)     Past Surgical History:  Procedure Laterality Date   BASAL CELL CARCINOMA EXCISION     HERNIA REPAIR     NO PAST SURGERIES     PTERYGIUM EXCISION     SQUAMOUS CELL CARCINOMA EXCISION      Family History  Problem Relation Age of Onset   Stroke Mother    Cancer Father    Stroke Brother    Autism Granddaughter     Social History   Socioeconomic History   Marital status: Divorced    Spouse name: Not on file   Number of children: Not on file   Years of education: Not on file   Highest education level: Not on file  Occupational History   Not on file  Tobacco Use   Smoking status: Never   Smokeless tobacco: Never  Vaping Use   Vaping Use: Never used   Substance and Sexual Activity   Alcohol use: Yes    Comment: on occasion   Drug use: Never   Sexual activity: Not Currently  Other Topics Concern   Not on file  Social History Narrative   Not on file   Social Determinants of Health   Financial Resource Strain: Not on file  Food Insecurity: Not on file  Transportation Needs: Not on file  Physical Activity: Not on file  Stress: Not on file  Social Connections: Not on file  Intimate Partner Violence: Not on file    Outpatient Medications Prior to Visit  Medication Sig Dispense Refill   omeprazole (PRILOSEC) 10 MG capsule Take 10 mg by mouth daily.     No facility-administered medications prior to visit.    Allergies  Allergen Reactions   Codeine Other (See Comments)    hallucinations   Melatonin Other (See Comments)    Drowsiness    Trazodone And Nefazodone Nausea And Vomiting    ROS Review of Systems  Constitutional:  Positive for fatigue.  HENT: Negative.    Respiratory:  Positive for shortness of breath (with exertion).   Cardiovascular: Negative.   Gastrointestinal: Negative.   Genitourinary: Negative.  Neurological: Negative.      Objective:    Physical Exam Vitals and nursing note reviewed.  Constitutional:      Appearance: Normal appearance.  HENT:     Head: Normocephalic.  Eyes:     Conjunctiva/sclera: Conjunctivae normal.  Cardiovascular:     Rate and Rhythm: Normal rate and regular rhythm.     Pulses: Normal pulses.     Heart sounds: Normal heart sounds.  Pulmonary:     Effort: Pulmonary effort is normal.     Breath sounds: Normal breath sounds.  Musculoskeletal:     Cervical back: Normal range of motion.     Right lower leg: No edema.     Left lower leg: No edema.  Skin:    General: Skin is warm and dry.  Neurological:     General: No focal deficit present.     Mental Status: He is alert and oriented to person, place, and time.  Psychiatric:        Mood and Affect: Mood normal.         Behavior: Behavior normal.        Thought Content: Thought content normal.        Judgment: Judgment normal.    BP (!) 145/82   Pulse 65   Temp 97.9 F (36.6 C) (Oral)   Ht 6' 0.05" (1.83 m)   Wt 244 lb 3.2 oz (110.8 kg)   SpO2 99%   BMI 33.08 kg/m  Wt Readings from Last 3 Encounters:  09/13/20 244 lb 3.2 oz (110.8 kg)  08/10/20 249 lb (112.9 kg)  08/09/20 248 lb 6.4 oz (112.7 kg)     Health Maintenance Due  Topic Date Due   INFLUENZA VACCINE  08/23/2020    There are no preventive care reminders to display for this patient.  Lab Results  Component Value Date   TSH 1.870 08/10/2020   Lab Results  Component Value Date   WBC 5.6 08/10/2020   HGB 16.5 08/10/2020   HCT 50.7 08/10/2020   MCV 89 08/10/2020   PLT 248 08/10/2020   Lab Results  Component Value Date   NA 139 08/10/2020   K 4.5 08/10/2020   CO2 22 08/10/2020   GLUCOSE 92 08/10/2020   BUN 14 08/10/2020   CREATININE 1.23 08/10/2020   BILITOT 0.4 08/10/2020   ALKPHOS 69 08/10/2020   AST 20 08/10/2020   ALT 24 08/10/2020   PROT 6.7 08/10/2020   ALBUMIN 4.6 08/10/2020   CALCIUM 9.2 08/10/2020   EGFR 63 08/10/2020   Lab Results  Component Value Date   CHOL 144 05/19/2020   Lab Results  Component Value Date   HDL 32 (A) 05/19/2020   Lab Results  Component Value Date   LDLCALC 93 05/19/2020   Lab Results  Component Value Date   TRIG 102 05/19/2020   No results found for: CHOLHDL No results found for: HGBA1C    Assessment & Plan:   Problem List Items Addressed This Visit       Cardiovascular and Mediastinum   Primary hypertension    BP today 145/82 which is consistent with home readings. Echocardiogram showed grade I diastolic dysfunction and dilated aorta and left atrium. Will start lisinopril-hctz. Continue monitoring BP at home. Discussed low salt diet. Follow-up in 4 weeks.       Relevant Medications   lisinopril-hydrochlorothiazide (ZESTORETIC) 10-12.5 MG tablet     Other    Dyspnea on exertion - Primary    Echocardiogram  on 09/06/20 showed normal EF and grade I diastolic dysfunction. Will start lisinopril-hctz. Will place referral to cardiology for evaluation and recommendations.       Relevant Orders   Ambulatory referral to Cardiology   Diastolic dysfunction    Noted on echocardiogram 09/06/20. Will start lisinopril-hctz and refer to cardiology.      Other Visit Diagnoses     Lumps on the skin       Small, soft area to right abdomen. Most likely lipoma. Declines referral to surgery at this time.        Meds ordered this encounter  Medications   lisinopril-hydrochlorothiazide (ZESTORETIC) 10-12.5 MG tablet    Sig: Take 1 tablet by mouth daily.    Dispense:  30 tablet    Refill:  1    Follow-up: Return in about 4 weeks (around 10/11/2020) for HTN.    Charyl Dancer, NP

## 2020-09-13 NOTE — Assessment & Plan Note (Signed)
BP today 145/82 which is consistent with home readings. Echocardiogram showed grade I diastolic dysfunction and dilated aorta and left atrium. Will start lisinopril-hctz. Continue monitoring BP at home. Discussed low salt diet. Follow-up in 4 weeks.

## 2020-09-14 ENCOUNTER — Telehealth: Payer: Self-pay

## 2020-09-14 NOTE — Telephone Encounter (Signed)
Lowest spo2 75%  Spoke to patient and relayed results.  Patient stated that he would like to discuss results with PCP before proceeding. He will call back with update.

## 2020-09-14 NOTE — Telephone Encounter (Signed)
ATC patient in regards to ONO results, Per Dr Mortimer Fries patient qualifies for 1L of O2 at night.  Will attempt to call patient again at a later time.

## 2020-09-16 ENCOUNTER — Telehealth: Payer: Self-pay | Admitting: Internal Medicine

## 2020-09-16 NOTE — Telephone Encounter (Signed)
Lm for patient.  

## 2020-09-16 NOTE — Telephone Encounter (Signed)
Spoke to patient, who is requesting a copy of ONO results to be mailed to his address on file and faxed to PCP.  ONO mailed to address on file and faxed to PCP.   He is questioning if Dr. Mortimer Fries thinks nocturnal desaturations could be related to OSA? No prior sleep study.   Dr. Mortimer Fries, please advise. Thanks

## 2020-09-16 NOTE — Telephone Encounter (Signed)
Lm for update.  

## 2020-09-16 NOTE — Telephone Encounter (Signed)
Please see 09/14/2020 phone encounter.

## 2020-09-20 NOTE — Telephone Encounter (Signed)
Dr. Kasa, please advise. Thanks °

## 2020-09-20 NOTE — Telephone Encounter (Signed)
Spoke to patient and relayed below message. Patient stated that he would call back to schedule OV, as he is currently driving.

## 2020-09-21 NOTE — Telephone Encounter (Signed)
Appt scheduled for 10/27/2020 at 9:30. Will close encounter.

## 2020-10-08 ENCOUNTER — Ambulatory Visit: Payer: Medicare Other | Admitting: Nurse Practitioner

## 2020-10-12 ENCOUNTER — Telehealth: Payer: Self-pay | Admitting: Nurse Practitioner

## 2020-10-12 NOTE — Telephone Encounter (Signed)
Copied from [place] [id number]. Topic: Medicare AWV >> Oct 12, 2020  4:10 PM Lavonia Drafts wrote: Reason for CRM:  Left message for patient to call back and schedule Medicare Annual Wellness Visit (AWV) to be done virtually or by telephone.  No hx of AWV eligible as of 01/24/15  Please schedule at anytime with [facility] Health Advisor.      60 Minutes appointment   Any questions, please call me at [phone]

## 2020-10-12 NOTE — Progress Notes (Signed)
Established Patient Office Visit  Subjective:  Patient ID: Juan West, male    DOB: 05-05-1950  Age: 70 y.o. MRN: [id number]  CC:  Chief Complaint  Patient presents with   Hypertension    HPI Juan West presents for follow up on hypertension and shortness of breath. He recently saw pulmonology and had an overnight oximetry study where he did show desaturations into the 80s. He was told that he needed an in person overnight sleep study, but is unable to complete this at this time due to having children at home.   HYPERTENSION  Hypertension status: controlled  Satisfied with current treatment? yes Duration of hypertension: months BP monitoring frequency:  daily BP range: 120s-130s/70s-80s BP medication side effects:  no Medication compliance: excellent compliance Previous BP meds:lisinopril-HCTZ Aspirin: no Recurrent headaches: no Visual changes: no Palpitations: yes- improving Dyspnea: yes with sudden activity Chest pain: no Lower extremity edema: no Dizzy/lightheaded: no   Past Medical History:  Diagnosis Date   Allergy    Cancer ([place])    GERD (gastroesophageal reflux disease)     Past Surgical History:  Procedure Laterality Date   BASAL CELL CARCINOMA EXCISION     HERNIA REPAIR     NO PAST SURGERIES     PTERYGIUM EXCISION     SQUAMOUS CELL CARCINOMA EXCISION      Family History  Problem Relation Age of Onset   Stroke Mother    Cancer Father    Stroke Brother    Autism Granddaughter     Social History   Socioeconomic History   Marital status: Divorced    Spouse name: Not on file   Number of children: Not on file   Years of education: Not on file   Highest education level: Not on file  Occupational History   Not on file  Tobacco Use   Smoking status: Never   Smokeless tobacco: Never  Vaping Use   Vaping Use: Never used  Substance and Sexual Activity   Alcohol use: Yes    Comment: on occasion   Drug use: Never   Sexual  activity: Not Currently  Other Topics Concern   Not on file  Social History Narrative   Not on file   Social Determinants of Health   Financial Resource Strain: Not on file  Food Insecurity: Not on file  Transportation Needs: Not on file  Physical Activity: Not on file  Stress: Not on file  Social Connections: Not on file  Intimate Partner Violence: Not on file    Outpatient Medications Prior to Visit  Medication Sig Dispense Refill   lisinopril-hydrochlorothiazide (ZESTORETIC) 10-12.5 MG tablet Take 1 tablet by mouth daily. 30 tablet 1   omeprazole (PRILOSEC) 10 MG capsule Take 10 mg by mouth daily.     No facility-administered medications prior to visit.    Allergies  Allergen Reactions   Codeine Other (See Comments)    hallucinations   Melatonin Other (See Comments)    Drowsiness    Trazodone And Nefazodone Nausea And Vomiting    ROS Review of Systems  Constitutional:  Positive for fatigue.  Respiratory:  Positive for shortness of breath (with sudden activity).   Cardiovascular:  Positive for palpitations. Negative for chest pain.  Gastrointestinal: Negative.   Genitourinary: Negative.   Musculoskeletal: Negative.   Skin: Negative.   Neurological: Negative.      Objective:    Physical Exam Vitals and nursing note reviewed.  Constitutional:      Appearance:  Normal appearance.  HENT:     Head: Normocephalic.  Eyes:     Conjunctiva/sclera: Conjunctivae normal.  Cardiovascular:     Rate and Rhythm: Normal rate and regular rhythm.     Pulses: Normal pulses.     Heart sounds: Normal heart sounds.  Pulmonary:     Effort: Pulmonary effort is normal.     Breath sounds: Normal breath sounds.  Musculoskeletal:     Cervical back: Normal range of motion.  Skin:    General: Skin is warm and dry.  Neurological:     General: No focal deficit present.     Mental Status: He is alert and oriented to person, place, and time.  Psychiatric:        Mood and Affect:  Mood normal.        Behavior: Behavior normal.        Thought Content: Thought content normal.        Judgment: Judgment normal.    BP 126/64   Pulse 77   Temp 97.9 F (36.6 C) (Oral)   Resp 20   Wt 241 lb 3.2 oz (109.4 kg)   SpO2 98%   BMI 32.67 kg/m  Wt Readings from Last 3 Encounters:  10/14/20 241 lb 3.2 oz (109.4 kg)  09/13/20 244 lb 3.2 oz (110.8 kg)  08/10/20 249 lb (112.9 kg)     Health Maintenance Due  Topic Date Due   COVID-19 Vaccine (3 - Booster for Moderna series) 08/09/2019    There are no preventive care reminders to display for this patient.  Lab Results  Component Value Date   TSH 1.870 08/10/2020   Lab Results  Component Value Date   WBC 5.6 08/10/2020   HGB 16.5 08/10/2020   HCT 50.7 08/10/2020   MCV 89 08/10/2020   PLT 248 08/10/2020   Lab Results  Component Value Date   NA 139 08/10/2020   K 4.5 08/10/2020   CO2 22 08/10/2020   GLUCOSE 92 08/10/2020   BUN 14 08/10/2020   CREATININE 1.23 08/10/2020   BILITOT 0.4 08/10/2020   ALKPHOS 69 08/10/2020   AST 20 08/10/2020   ALT 24 08/10/2020   PROT 6.7 08/10/2020   ALBUMIN 4.6 08/10/2020   CALCIUM 9.2 08/10/2020   EGFR 63 08/10/2020   Lab Results  Component Value Date   CHOL 144 05/19/2020   Lab Results  Component Value Date   HDL 32 (A) 05/19/2020   Lab Results  Component Value Date   LDLCALC 93 05/19/2020   Lab Results  Component Value Date   TRIG 102 05/19/2020   No results found for: CHOLHDL No results found for: HGBA1C    Assessment & Plan:   Problem List Items Addressed This Visit       Cardiovascular and Mediastinum   Primary hypertension - Primary    Controlled after starting lisinopril/hctz. Had some dizziness at first, but resolved after first week of medication. Will check BMP today. Follow up in 3 months. Refills sent to pharmacy      Relevant Orders   Basic Metabolic Panel (BMET)     Other   Dyspnea on exertion    Ongoing. States he can't do sudden  activity without getting short of breath, like climbing stairs. However, if he paces himself, he can do most activities. He was able to shovel and rake 10 yards of dirt with no issues a few days ago. He did take breaks and took his time. He has an  appointment with cardiology next month. Will await cardiology evaluation and recommendations. He is not able to go for sleep study at this time, due to having children at home he can't leave overnight.       Other Visit Diagnoses     Flu vaccine need       Flu vaccine given today   Relevant Orders   Flu Vaccine QUAD High Dose(Fluad) (Completed)       No orders of the defined types were placed in this encounter.   Follow-up: Return in about 3 months (around 01/13/2021) for htn, sob.    Charyl Dancer, NP

## 2020-10-14 ENCOUNTER — Ambulatory Visit (INDEPENDENT_AMBULATORY_CARE_PROVIDER_SITE_OTHER): Payer: Medicare Other | Admitting: Nurse Practitioner

## 2020-10-14 ENCOUNTER — Encounter: Payer: Self-pay | Admitting: Nurse Practitioner

## 2020-10-14 ENCOUNTER — Other Ambulatory Visit: Payer: Self-pay

## 2020-10-14 VITALS — BP 126/64 | HR 77 | Temp 97.9°F | Resp 20 | Wt 241.2 lb

## 2020-10-14 DIAGNOSIS — Z23 Encounter for immunization: Secondary | ICD-10-CM

## 2020-10-14 DIAGNOSIS — I10 Essential (primary) hypertension: Secondary | ICD-10-CM | POA: Diagnosis not present

## 2020-10-14 DIAGNOSIS — R0600 Dyspnea, unspecified: Secondary | ICD-10-CM

## 2020-10-14 DIAGNOSIS — R0609 Other forms of dyspnea: Secondary | ICD-10-CM

## 2020-10-14 NOTE — Patient Instructions (Signed)
>>   Oct 12, 2020  4:10 PM Lavonia Drafts wrote: Reason for CRM:  Left message for patient to call back and schedule Medicare Annual Wellness Visit (AWV) to be done virtually or by telephone.   No hx of AWV eligible as of 01/24/15>> Oct 12, 2020  4:10 PM Lavonia Drafts wrote: Reason for CRM:  Left message for patient to call back and schedule Medicare Annual Wellness Visit (AWV) to be done virtually or by telephone.   No hx of AWV eligible as of 01/24/15   Please schedule at anytime with [facility]urse Health Advisor.       68 Minutes appointment    Any questions, please call me at [phone]   Please schedule at anytime with [facility] Health Advisor.       2 Minutes appointment    Any questions, please call me at [phone]

## 2020-10-14 NOTE — Assessment & Plan Note (Signed)
Ongoing. States he can't do sudden activity without getting short of breath, like climbing stairs. However, if he paces himself, he can do most activities. He was able to shovel and rake 10 yards of dirt with no issues a few days ago. He did take breaks and took his time. He has an appointment with cardiology next month. Will await cardiology evaluation and recommendations. He is not able to go for sleep study at this time, due to having children at home he can't leave overnight.

## 2020-10-14 NOTE — Assessment & Plan Note (Addendum)
Controlled after starting lisinopril/hctz. Had some dizziness at first, but resolved after first week of medication. Will check BMP today. Follow up in 3 months. Refills sent to pharmacy

## 2020-10-15 LAB — BASIC METABOLIC PANEL WITH GFR
BUN/Creatinine Ratio: 15 (ref 10–24)
BUN: 19 mg/dL (ref 8–27)
CO2: 21 mmol/L (ref 20–29)
Calcium: 9.3 mg/dL (ref 8.6–10.2)
Chloride: 100 mmol/L (ref 96–106)
Creatinine, Ser: 1.28 mg/dL — ABNORMAL HIGH (ref 0.76–1.27)
Glucose: 117 mg/dL — ABNORMAL HIGH (ref 65–99)
Potassium: 4.2 mmol/L (ref 3.5–5.2)
Sodium: 140 mmol/L (ref 134–144)
eGFR: 60 mL/min/1.73 (ref >59)

## 2020-10-15 MED ORDER — LISINOPRIL-HYDROCHLOROTHIAZIDE 10-12.5 MG PO TABS
1.0000 | ORAL_TABLET | Freq: Every day | ORAL | 1 refills | Status: DC
Start: 2020-10-15 — End: 2021-05-11

## 2020-10-15 NOTE — Addendum Note (Signed)
Addended by: Vance Peper A on: 10/15/2020 08:26 AM   Modules accepted: Orders

## 2020-10-27 ENCOUNTER — Ambulatory Visit: Payer: Medicare Other | Admitting: Internal Medicine

## 2020-11-09 ENCOUNTER — Ambulatory Visit (INDEPENDENT_AMBULATORY_CARE_PROVIDER_SITE_OTHER): Payer: Medicare Other

## 2020-11-09 ENCOUNTER — Ambulatory Visit: Payer: Medicare Other | Admitting: Cardiology

## 2020-11-09 ENCOUNTER — Other Ambulatory Visit: Payer: Self-pay

## 2020-11-09 ENCOUNTER — Encounter: Payer: Self-pay | Admitting: Cardiology

## 2020-11-09 VITALS — BP 122/68 | HR 73 | Ht 72.0 in | Wt 243.6 lb

## 2020-11-09 DIAGNOSIS — I491 Atrial premature depolarization: Secondary | ICD-10-CM | POA: Diagnosis not present

## 2020-11-09 DIAGNOSIS — I493 Ventricular premature depolarization: Secondary | ICD-10-CM

## 2020-11-09 DIAGNOSIS — R002 Palpitations: Secondary | ICD-10-CM | POA: Diagnosis not present

## 2020-11-09 DIAGNOSIS — R0609 Other forms of dyspnea: Secondary | ICD-10-CM

## 2020-11-09 DIAGNOSIS — R079 Chest pain, unspecified: Secondary | ICD-10-CM

## 2020-11-09 DIAGNOSIS — R072 Precordial pain: Secondary | ICD-10-CM

## 2020-11-09 MED ORDER — METOPROLOL TARTRATE 100 MG PO TABS
ORAL_TABLET | ORAL | 0 refills | Status: DC
Start: 2020-11-09 — End: 2021-01-13

## 2020-11-09 MED ORDER — NITROGLYCERIN 0.4 MG SL SUBL: 0.4000 mg | SUBLINGUAL_TABLET | SUBLINGUAL | 3 refills | Status: DC | PRN

## 2020-11-09 NOTE — Progress Notes (Unsigned)
Patient enrolled for [employer] to mail a 7 day ZIO XT monitor to his address on file. 

## 2020-11-09 NOTE — Patient Instructions (Addendum)
Medication Instructions:  Your physician has recommended you make the following change in your medication:  START: Nitroglycerin 0.4 mg take one tablet by mouth every 5 minutes up to three times as needed for chest pain.  *If you need a refill on your cardiac medications before your next appointment, please call your pharmacy*   Lab Work: Your physician recommends that you return for lab work in:  3-7 days before CT: BMET, Mag If you have labs (blood work) drawn today and your tests are completely normal, you will receive your results only by: [place] (if you have MyChart) OR A paper copy in the mail If you have any lab test that is abnormal or we need to change your treatment, we will call you to review the results.   Testing/Procedures:   Your cardiac CT will be scheduled at one of the below locations:   [facility] [address] [place],  [address] [phone]  If scheduled at [facility], please arrive at the [facility] main entrance (entrance A) of [facility] 30 minutes prior to test start time. You can use the FREE valet parking offered at the main entrance (encouraged to control the heart rate for the test) Proceed to the [facility] Radiology Department (first floor) to check-in and test prep.  Please follow these instructions carefully (unless otherwise directed):   On the Night Before the Test: Be sure to Drink plenty of water. Do not consume any caffeinated/decaffeinated beverages or chocolate 12 hours prior to your test. Do not take any antihistamines 12 hours prior to your test.  On the Day of the Test: Drink plenty of water until 1 hour prior to the test. Do not eat any food 4 hours prior to the test. You may take your regular medications prior to the test.  Take metoprolol (Lopressor) two hours prior to test. HOLD Hydrochlorothiazide morning of the test.  After the Test: Drink plenty of water. After  receiving IV contrast, you may experience a mild flushed feeling. This is normal. On occasion, you may experience a mild rash up to 24 hours after the test. This is not dangerous. If this occurs, you can take Benadryl 25 mg and increase your fluid intake. If you experience trouble breathing, this can be serious. If it is severe call 911 IMMEDIATELY. If it is mild, please call our office. If you take any of these medications: Glipizide/Metformin, Avandament, Glucavance, please do not take 48 hours after completing test unless otherwise instructed.  Please allow 2-4 weeks for scheduling of routine cardiac CTs. Some insurance companies require a pre-authorization which may delay scheduling of this test.   For non-scheduling related questions, please contact the cardiac imaging nurse navigator should you have any questions/concerns: Marchia Bond, Cardiac Imaging Nurse Navigator Gordy Clement, Cardiac Imaging Nurse Navigator [facility] Direct Office Dial: [phone]   For scheduling needs, including cancellations and rescheduling, please call [place], [phone].   Follow-Up: At [facility], you and your health needs are our priority.  As part of our continuing mission to provide you with exceptional heart care, we have created designated Provider Care Teams.  These Care Teams include your primary Cardiologist (physician) and Advanced Practice Providers (APPs -  Physician Assistants and Nurse Practitioners) who all work together to provide you with the care you need, when you need it.  We recommend signing up for the patient portal called "MyChart".  Sign up information is provided on  this After Visit Summary.  MyChart is used to connect with patients for Virtual Visits (Telemedicine).  Patients are able to view lab/test results, encounter notes, upcoming appointments, etc.  Non-urgent messages can be sent to your provider as well.   To learn more about what you  can do with MyChart, go to [email or web address].    Your next appointment:   4 month(s)  The format for your next appointment:   In Person  Provider:   Berniece Salines, DO [address], [place], [place] [address]    Other Instructions

## 2020-11-09 NOTE — Progress Notes (Signed)
I reviewed.  I appreciate that okay I appreciate you and glad to Cardiology Office Note:    Date:  11/09/2020   ID:  Juan West, DOB [birth year], MRN [id number]  PCP:  Charyl Dancer, NP  Cardiologist:  Berniece Salines, DO  Electrophysiologist:  None   Referring MD: Charyl Dancer, NP   " I am having chest pain and shortness of breath"  History of Present Illness:    Juan West is a 70 y.o. male with a hx of GERD, basal cell carcinoma, squamous cell carcinoma s/p excision presents today for evaluation of chest pain and shortness of breath.  The patient reports that over the last few months he has been experiencing chest discomfort and dyspnea on exertion. He tells me that he is an otherwise healthy and active person. But recently he feels that no matter what he does he experiences mid sternal chest tightness. She qualifies it as a 4/10 with no radiation. He tells me that the biggest issue for him was the shortness of breath and that is the most bothersome.  He is unable to tolerate any activity without getting significantly short of breath.  He is really concerned about this.  Past Medical History:  Diagnosis Date   Allergy    Cancer ([place])    GERD (gastroesophageal reflux disease)     Past Surgical History:  Procedure Laterality Date   BASAL CELL CARCINOMA EXCISION     HERNIA REPAIR     NO PAST SURGERIES     PTERYGIUM EXCISION     SQUAMOUS CELL CARCINOMA EXCISION      Current Medications: Current Meds  Medication Sig   lisinopril-hydrochlorothiazide (ZESTORETIC) 10-12.5 MG tablet Take 1 tablet by mouth daily.   metoprolol tartrate (LOPRESSOR) 100 MG tablet Take 2 hours prior to CT   nitroGLYCERIN (NITROSTAT) 0.4 MG SL tablet Place 1 tablet (0.4 mg total) under the tongue every 5 (five) minutes as needed for chest pain.   omeprazole (PRILOSEC) 10 MG capsule Take 10 mg by mouth daily.     Allergies:   Codeine, Melatonin, and Trazodone and nefazodone    Social History   Socioeconomic History   Marital status: Divorced    Spouse name: Not on file   Number of children: Not on file   Years of education: Not on file   Highest education level: Not on file  Occupational History   Not on file  Tobacco Use   Smoking status: Never   Smokeless tobacco: Never  Vaping Use   Vaping Use: Never used  Substance and Sexual Activity   Alcohol use: Yes    Comment: on occasion   Drug use: Never   Sexual activity: Not Currently  Other Topics Concern   Not on file  Social History Narrative   Not on file   Social Determinants of Health   Financial Resource Strain: Not on file  Food Insecurity: Not on file  Transportation Needs: Not on file  Physical Activity: Not on file  Stress: Not on file  Social Connections: Not on file     Family History: The patient's family history includes Autism in his granddaughter; Cancer in his father; Stroke in his brother and mother.  ROS:   Review of Systems  Constitution: Negative for decreased appetite, fever and weight gain.  HENT: Negative for congestion, ear discharge, hoarse voice and sore throat.   Eyes: Negative for discharge, redness, vision loss in right eye and visual halos.  Cardiovascular:  Reports for chest pain, dyspnea on exertion,.  Negative for leg swelling, orthopnea and palpitations.  Respiratory: Negative for cough, hemoptysis, shortness of breath and snoring.   Endocrine: Negative for heat intolerance and polyphagia.  Hematologic/Lymphatic: Negative for bleeding problem. Does not bruise/bleed easily.  Skin: Negative for flushing, nail changes, rash and suspicious lesions.  Musculoskeletal: Negative for arthritis, joint pain, muscle cramps, myalgias, neck pain and stiffness.  Gastrointestinal: Negative for abdominal pain, bowel incontinence, diarrhea and excessive appetite.  Genitourinary: Negative for decreased libido, genital sores and incomplete emptying.  Neurological: Negative  for brief paralysis, focal weakness, headaches and loss of balance.  Psychiatric/Behavioral: Negative for altered mental status, depression and suicidal ideas.  Allergic/Immunologic: Negative for HIV exposure and persistent infections.    EKGs/Labs/Other Studies Reviewed:    The following studies were reviewed today:   EKG:  The ekg ordered today demonstrates sinus rhythm, heart rate 70 bpm with occasional PVCs and PACs.  Recent Labs: 08/10/2020: ALT 24; Hemoglobin 16.5; Platelets 248; TSH 1.870 10/14/2020: BUN 19; Creatinine, Ser 1.28; Potassium 4.2; Sodium 140  Recent Lipid Panel    Component Value Date/Time   CHOL 144 05/19/2020 0000   TRIG 102 05/19/2020 0000   HDL 32 (A) 05/19/2020 0000   LDLCALC 93 05/19/2020 0000    Physical Exam:    VS:  BP 122/68 (BP Location: Left Arm, Patient Position: Sitting, Cuff Size: Normal)   Pulse 73   Ht 6' (1.829 m)   Wt 243 lb 9.6 oz (110.5 kg)   SpO2 98%   BMI 33.04 kg/m     Wt Readings from Last 3 Encounters:  11/09/20 243 lb 9.6 oz (110.5 kg)  10/14/20 241 lb 3.2 oz (109.4 kg)  09/13/20 244 lb 3.2 oz (110.8 kg)     GEN: Well nourished, well developed in no acute distress HEENT: Normal NECK: No JVD; No carotid bruits LYMPHATICS: No lymphadenopathy CARDIAC: S1S2 noted,RRR, no murmurs, rubs, gallops RESPIRATORY:  Clear to auscultation without rales, wheezing or rhonchi  ABDOMEN: Soft, non-tender, non-distended, +bowel sounds, no guarding. EXTREMITIES: No edema, No cyanosis, no clubbing MUSCULOSKELETAL:  No deformity  SKIN: Warm and dry NEUROLOGIC:  Alert and oriented x 3, non-focal PSYCHIATRIC:  Normal affect, good insight  ASSESSMENT:    1. Precordial pain   2. Chest pain of uncertain etiology   3. PAC (premature atrial contraction)   4. Palpitations   5. PVC (premature ventricular contraction)   6. DOE (dyspnea on exertion)    PLAN:     The symptoms chest pain and dyspnea on exertion is concerning, this patient  does have intermediate risk for coronary artery disease and at this time I would like to pursue an ischemic evaluation in this patient.  Shared decision a coronary CTA at this time is appropriate.  I have discussed with the patient about the testing.  The patient has no IV contrast allergy and is agreeable to proceed with this test.  Sublingual nitroglycerin prescription was sent, its protocol and 911 protocol explained and the patient vocalized understanding questions were answered to the patient's satisfaction.  Echocardiogram will also be done to assess LV/RV function and any other structure abnormalities in the setting of his dizziness or shortness of.  ZIO monitor will be applied and patient to assess the burden of his PACs and PVCs.   The patient is in agreement with the above plan. The patient left the office in stable condition.  The patient will follow up in 12 weeks.  Medication Adjustments/Labs and Tests Ordered: Current medicines are reviewed at length with the patient today.  Concerns regarding medicines are outlined above.  Orders Placed This Encounter  Procedures   CT CORONARY MORPH W/CTA COR W/SCORE W/CA W/CM &/OR WO/CM   Basic Metabolic Panel (BMET)   Magnesium   LONG TERM MONITOR (3-14 DAYS)   EKG 12-Lead   Meds ordered this encounter  Medications   nitroGLYCERIN (NITROSTAT) 0.4 MG SL tablet    Sig: Place 1 tablet (0.4 mg total) under the tongue every 5 (five) minutes as needed for chest pain.    Dispense:  90 tablet    Refill:  3   metoprolol tartrate (LOPRESSOR) 100 MG tablet    Sig: Take 2 hours prior to CT    Dispense:  1 tablet    Refill:  0    Patient Instructions  Medication Instructions:  Your physician has recommended you make the following change in your medication:  START: Nitroglycerin 0.4 mg take one tablet by mouth every 5 minutes up to three times as needed for chest pain.  *If you need a refill on your cardiac medications before your next  appointment, please call your pharmacy*   Lab Work: Your physician recommends that you return for lab work in:  3-7 days before CT: BMET, Mag If you have labs (blood work) drawn today and your tests are completely normal, you will receive your results only by: [place] (if you have MyChart) OR A paper copy in the mail If you have any lab test that is abnormal or we need to change your treatment, we will call you to review the results.   Testing/Procedures:   Your cardiac CT will be scheduled at one of the below locations:   [facility] [address] [place], [place] [address] [phone]  If scheduled at [facility], please arrive at the [facility] main entrance (entrance A) of [facility] 30 minutes prior to test start time. You can use the FREE valet parking offered at the main entrance (encouraged to control the heart rate for the test) Proceed to the [facility] Radiology Department (first floor) to check-in and test prep.  Please follow these instructions carefully (unless otherwise directed):   On the Night Before the Test: Be sure to Drink plenty of water. Do not consume any caffeinated/decaffeinated beverages or chocolate 12 hours prior to your test. Do not take any antihistamines 12 hours prior to your test.  On the Day of the Test: Drink plenty of water until 1 hour prior to the test. Do not eat any food 4 hours prior to the test. You may take your regular medications prior to the test.  Take metoprolol (Lopressor) two hours prior to test. HOLD Hydrochlorothiazide morning of the test.  After the Test: Drink plenty of water. After receiving IV contrast, you may experience a mild flushed feeling. This is normal. On occasion, you may experience a mild rash up to 24 hours after the test. This is not dangerous. If this occurs, you can take Benadryl 25 mg and increase your fluid intake. If you experience trouble breathing, this  can be serious. If it is severe call 911 IMMEDIATELY. If it is mild, please call our office. If you take any of these medications: Glipizide/Metformin, Avandament, Glucavance, please do not take 48 hours after completing test unless otherwise instructed.  Please allow 2-4 weeks for scheduling of routine cardiac CTs. Some insurance companies require a pre-authorization  which may delay scheduling of this test.   For non-scheduling related questions, please contact the cardiac imaging nurse navigator should you have any questions/concerns: Marchia Bond, Cardiac Imaging Nurse Navigator Gordy Clement, Cardiac Imaging Nurse Navigator [place] [facility] Direct Office Dial: [phone]   For scheduling needs, including cancellations and rescheduling, please call [place], [phone].   Follow-Up: At [facility], you and your health needs are our priority.  As part of our continuing mission to provide you with exceptional heart care, we have created designated Provider Care Teams.  These Care Teams include your primary Cardiologist (physician) and Advanced Practice Providers (APPs -  Physician Assistants and Nurse Practitioners) who all work together to provide you with the care you need, when you need it.  We recommend signing up for the patient portal called "MyChart".  Sign up information is provided on this After Visit Summary.  MyChart is used to connect with patients for Virtual Visits (Telemedicine).  Patients are able to view lab/test results, encounter notes, upcoming appointments, etc.  Non-urgent messages can be sent to your provider as well.   To learn more about what you can do with MyChart, go to [email or web address].    Your next appointment:   4 month(s)  The format for your next appointment:   In Person  Provider:   Berniece Salines, DO [address], [place],  [address]    Other Instructions     Adopting a Healthy Lifestyle.  Know what  a healthy weight is for you (roughly BMI <25) and aim to maintain this   Aim for 7+ servings of fruits and vegetables daily   65-80+ fluid ounces of water or unsweet tea for healthy kidneys   Limit to max 1 drink of alcohol per day; avoid smoking/tobacco   Limit animal fats in diet for cholesterol and heart health - choose grass fed whenever available   Avoid highly processed foods, and foods high in saturated/trans fats   Aim for low stress - take time to unwind and care for your mental health   Aim for 150 min of moderate intensity exercise weekly for heart health, and weights twice weekly for bone health   Aim for 7-9 hours of sleep daily   When it comes to diets, agreement about the perfect plan isnt easy to find, even among the experts. Experts at the [place] developed an idea known as the Healthy Eating Plate. Just imagine a plate divided into logical, healthy portions.   The emphasis is on diet quality:   Load up on vegetables and fruits - one-half of your plate: Aim for color and variety, and remember that potatoes dont count.   Go for whole grains - one-quarter of your plate: Whole wheat, barley, wheat berries, quinoa, oats, brown rice, and foods made with them. If you want pasta, go with whole wheat pasta.   Protein power - one-quarter of your plate: Fish, chicken, beans, and nuts are all healthy, versatile protein sources. Limit red meat.   The diet, however, does go beyond the plate, offering a few other suggestions.   Use healthy plant oils, such as olive, canola, soy, corn, sunflower and peanut. Check the labels, and avoid partially hydrogenated oil, which have unhealthy trans fats.   If youre thirsty, drink water. Coffee and tea are good in moderation, but skip sugary drinks and limit milk and dairy products to one or two daily servings.   The type of carbohydrate in  the diet is more important than the amount. Some sources of carbohydrates,  such as vegetables, fruits, whole grains, and beans-are healthier than others.   Finally, stay active  Signed, Berniece Salines, DO  11/09/2020 5:18 PM    [facility]

## 2020-11-10 ENCOUNTER — Encounter (HOSPITAL_COMMUNITY): Payer: Self-pay

## 2020-11-11 ENCOUNTER — Other Ambulatory Visit
Admission: RE | Admit: 2020-11-11 | Discharge: 2020-11-11 | Disposition: A | Discharge status: Home / Self Care | Payer: Medicare Other | Attending: Cardiology | Admitting: Cardiology

## 2020-11-11 DIAGNOSIS — R0609 Other forms of dyspnea: Secondary | ICD-10-CM | POA: Insufficient documentation

## 2020-11-11 DIAGNOSIS — R079 Chest pain, unspecified: Secondary | ICD-10-CM | POA: Insufficient documentation

## 2020-11-11 LAB — BASIC METABOLIC PANEL
Anion gap: 9 (ref 5–15)
BUN: 25 mg/dL — ABNORMAL HIGH (ref 8–23)
CO2: 25 mmol/L (ref 22–32)
Calcium: 9.3 mg/dL (ref 8.9–10.3)
Chloride: 102 mmol/L (ref 98–111)
Creatinine, Ser: 1.22 mg/dL (ref 0.61–1.24)
GFR, Estimated: >60 mL/min (ref >60)
Glucose, Bld: 80 mg/dL (ref 70–99)
Potassium: 4.3 mmol/L (ref 3.5–5.1)
Sodium: 136 mmol/L (ref 135–145)

## 2020-11-11 LAB — MAGNESIUM: Magnesium: 2 mg/dL (ref 1.7–2.4)

## 2020-11-13 DIAGNOSIS — I491 Atrial premature depolarization: Secondary | ICD-10-CM

## 2020-11-13 DIAGNOSIS — I493 Ventricular premature depolarization: Secondary | ICD-10-CM | POA: Diagnosis not present

## 2020-11-16 ENCOUNTER — Telehealth (HOSPITAL_COMMUNITY): Payer: Self-pay | Admitting: Emergency Medicine

## 2020-11-16 NOTE — Telephone Encounter (Signed)
Attempted to call patient regarding upcoming cardiac CT appointment. °Left message on voicemail with name and callback number °Mcdaniel Ohms RN Navigator Cardiac Imaging ° Heart [facility] Vascular Services °[phone] Office °[phone] Cell ° °

## 2020-11-16 NOTE — Telephone Encounter (Signed)
Reaching out to patient to offer assistance regarding upcoming cardiac imaging study; pt verbalizes understanding of appt date/time, parking situation and where to check in, pre-test NPO status and medications ordered, and verified current allergies; name and call back number provided for further questions should they arise Jaunita Mikels RN Navigator Cardiac Imaging [place] [phone] office [phone] cell  Denies iv issues 100mg metoprolol   

## 2020-11-17 ENCOUNTER — Other Ambulatory Visit: Payer: Self-pay

## 2020-11-17 ENCOUNTER — Ambulatory Visit (HOSPITAL_COMMUNITY)
Admission: RE | Admit: 2020-11-17 | Discharge: 2020-11-17 | Disposition: A | Discharge status: Home / Self Care | Payer: Medicare Other | Source: Ambulatory Visit | Attending: Cardiology | Admitting: Cardiology

## 2020-11-17 DIAGNOSIS — R072 Precordial pain: Secondary | ICD-10-CM | POA: Diagnosis not present

## 2020-11-17 MED ORDER — IOHEXOL 350 MG/ML SOLN
100.0000 mL | Freq: Once | INTRAVENOUS | Status: AC | PRN
  Administered 2020-11-17: 100 mL via INTRAVENOUS

## 2020-11-17 MED ORDER — NITROGLYCERIN 0.4 MG SL SUBL
SUBLINGUAL_TABLET | SUBLINGUAL | Status: AC
  Administered 2020-11-17: 0.8 mg via SUBLINGUAL
  Filled 2020-11-17: qty 2

## 2020-11-17 MED ORDER — NITROGLYCERIN 0.4 MG SL SUBL: 0.8000 mg | SUBLINGUAL_TABLET | Freq: Once | SUBLINGUAL | Status: AC

## 2020-11-25 ENCOUNTER — Other Ambulatory Visit: Payer: Self-pay

## 2020-11-25 ENCOUNTER — Encounter: Payer: Self-pay | Admitting: Nurse Practitioner

## 2020-11-25 ENCOUNTER — Ambulatory Visit (INDEPENDENT_AMBULATORY_CARE_PROVIDER_SITE_OTHER): Payer: Medicare Other | Admitting: Nurse Practitioner

## 2020-11-25 VITALS — BP 135/73 | HR 60 | Temp 97.6°F | Ht 71.5 in | Wt 240.4 lb

## 2020-11-25 DIAGNOSIS — Z23 Encounter for immunization: Secondary | ICD-10-CM

## 2020-11-25 DIAGNOSIS — T148XXA Other injury of unspecified body region, initial encounter: Secondary | ICD-10-CM | POA: Diagnosis not present

## 2020-11-25 DIAGNOSIS — R229 Localized swelling, mass and lump, unspecified: Secondary | ICD-10-CM

## 2020-11-25 NOTE — Progress Notes (Signed)
Established Patient Office Visit  Subjective:  Patient ID: Juan West, male    DOB: 08/24/1950  Age: 70 y.o. MRN: [id number]  CC:  Chief Complaint  Patient presents with   Cyst    Under right peck. Has doubled in size in 4 weeks. Very tender.    HPI Juan West presents for lump under his skin on right upper abdomen that has increased in size over the last 4 weeks. Area is sore to touch. He denies drainage, warmth, and fevers.   Past Medical History:  Diagnosis Date   Allergy    Cancer ([place])    GERD (gastroesophageal reflux disease)     Past Surgical History:  Procedure Laterality Date   BASAL CELL CARCINOMA EXCISION     HERNIA REPAIR     NO PAST SURGERIES     PTERYGIUM EXCISION     SQUAMOUS CELL CARCINOMA EXCISION      Family History  Problem Relation Age of Onset   Stroke Mother    Cancer Father    Stroke Brother    Autism Granddaughter     Social History   Socioeconomic History   Marital status: Divorced    Spouse name: Not on file   Number of children: Not on file   Years of education: Not on file   Highest education level: Not on file  Occupational History   Not on file  Tobacco Use   Smoking status: Never   Smokeless tobacco: Never  Vaping Use   Vaping Use: Never used  Substance and Sexual Activity   Alcohol use: Yes    Comment: on occasion   Drug use: Never   Sexual activity: Not Currently  Other Topics Concern   Not on file  Social History Narrative   Not on file   Social Determinants of Health   Financial Resource Strain: Not on file  Food Insecurity: Not on file  Transportation Needs: Not on file  Physical Activity: Not on file  Stress: Not on file  Social Connections: Not on file  Intimate Partner Violence: Not on file    Outpatient Medications Prior to Visit  Medication Sig Dispense Refill   lisinopril-hydrochlorothiazide (ZESTORETIC) 10-12.5 MG tablet Take 1 tablet by mouth daily. 90 tablet 1   metoprolol  tartrate (LOPRESSOR) 100 MG tablet Take 2 hours prior to CT 1 tablet 0   omeprazole (PRILOSEC) 10 MG capsule Take 10 mg by mouth daily.     nitroGLYCERIN (NITROSTAT) 0.4 MG SL tablet Place 1 tablet (0.4 mg total) under the tongue every 5 (five) minutes as needed for chest pain. 90 tablet 3   No facility-administered medications prior to visit.    Allergies  Allergen Reactions   Codeine Other (See Comments)    hallucinations   Melatonin Other (See Comments)    Drowsiness    Trazodone And Nefazodone Nausea And Vomiting    ROS Review of Systems  Constitutional: Negative.   Respiratory: Negative.    Cardiovascular: Negative.   Skin:        Lump under skin on right upper abdomen/lower chest     Objective:    Physical Exam Vitals and nursing note reviewed.  Constitutional:      Appearance: Normal appearance.  HENT:     Head: Normocephalic.  Pulmonary:     Effort: Pulmonary effort is normal.  Musculoskeletal:     Cervical back: Normal range of motion.  Skin:    General: Skin is warm and dry.  Comments: Scabbed abrasion to left lower shin 1.5 cm x 1.5 cm raised area under skin on right upper abdomen.   Neurological:     General: No focal deficit present.     Mental Status: He is alert and oriented to person, place, and time.  Psychiatric:        Mood and Affect: Mood normal.        Behavior: Behavior normal.        Thought Content: Thought content normal.        Judgment: Judgment normal.    BP 135/73   Pulse 60   Temp 97.6 F (36.4 C) (Oral)   Ht 5' 11.5" (1.816 m)   Wt 240 lb 6 oz (109 kg)   SpO2 95%   BMI 33.06 kg/m  Wt Readings from Last 3 Encounters:  11/25/20 240 lb 6 oz (109 kg)  11/09/20 243 lb 9.6 oz (110.5 kg)  10/14/20 241 lb 3.2 oz (109.4 kg)     Health Maintenance Due  Topic Date Due   COVID-19 Vaccine (3 - Booster for Moderna series) 05/07/2019    There are no preventive care reminders to display for this patient.  Lab Results   Component Value Date   TSH 1.870 08/10/2020   Lab Results  Component Value Date   WBC 5.6 08/10/2020   HGB 16.5 08/10/2020   HCT 50.7 08/10/2020   MCV 89 08/10/2020   PLT 248 08/10/2020   Lab Results  Component Value Date   NA 136 11/11/2020   K 4.3 11/11/2020   CO2 25 11/11/2020   GLUCOSE 80 11/11/2020   BUN 25 (H) 11/11/2020   CREATININE 1.22 11/11/2020   BILITOT 0.4 08/10/2020   ALKPHOS 69 08/10/2020   AST 20 08/10/2020   ALT 24 08/10/2020   PROT 6.7 08/10/2020   ALBUMIN 4.6 08/10/2020   CALCIUM 9.3 11/11/2020   ANIONGAP 9 11/11/2020   EGFR 60 10/14/2020   Lab Results  Component Value Date   CHOL 144 05/19/2020   Lab Results  Component Value Date   HDL 32 (A) 05/19/2020   Lab Results  Component Value Date   LDLCALC 93 05/19/2020   Lab Results  Component Value Date   TRIG 102 05/19/2020   No results found for: CHOLHDL No results found for: HGBA1C    Assessment & Plan:   Problem List Items Addressed This Visit   None Visit Diagnoses     Lump of skin    -  Primary   Cyst vs lipoma. Will refer to surgery for treatment due to increased pain and increasing size. No signs of infection.    Relevant Orders   Ambulatory referral to General Surgery   Abrasion       Abrasian to left leg, healing well. Will update Td today   Relevant Orders   Td : Tetanus/diphtheria >7yo Preservative  free (Completed)       No orders of the defined types were placed in this encounter.   Follow-up: Return if symptoms worsen or fail to improve.    Charyl Dancer, NP

## 2020-12-07 ENCOUNTER — Ambulatory Visit: Payer: Medicare Other | Admitting: Surgery

## 2020-12-07 ENCOUNTER — Other Ambulatory Visit: Payer: Self-pay

## 2020-12-07 ENCOUNTER — Encounter: Payer: Self-pay | Admitting: Surgery

## 2020-12-07 VITALS — BP 130/76 | HR 88 | Temp 98.0°F | Ht 72.0 in | Wt 241.6 lb

## 2020-12-07 DIAGNOSIS — L723 Sebaceous cyst: Secondary | ICD-10-CM | POA: Diagnosis not present

## 2020-12-07 NOTE — Progress Notes (Signed)
Patient ID: Juan West, male   DOB: November 29, 1950, 70 y.o.   MRN: [id number]  Chief Complaint: Right chest wall mass  History of Present Illness Juan West is a 70 y.o. male with right chest wall mass present for about a year.  Over the last month he reports it is gotten somewhat larger and more tender.  There has been a slight change in coloration.  He denies fevers chills.  He denies pain or drainage.  No associated nausea or vomiting.  No known recent trauma.  Past Medical History Past Medical History:  Diagnosis Date   Allergy    Cancer ([place])    GERD (gastroesophageal reflux disease)       Past Surgical History:  Procedure Laterality Date   BASAL CELL CARCINOMA EXCISION     HERNIA REPAIR     NO PAST SURGERIES     PTERYGIUM EXCISION     SQUAMOUS CELL CARCINOMA EXCISION      Allergies  Allergen Reactions   Codeine Other (See Comments)    hallucinations   Melatonin Other (See Comments)    Drowsiness    Trazodone And Nefazodone Nausea And Vomiting    Current Outpatient Medications  Medication Sig Dispense Refill   lisinopril-hydrochlorothiazide (ZESTORETIC) 10-12.5 MG tablet Take 1 tablet by mouth daily. 90 tablet 1   metoprolol tartrate (LOPRESSOR) 100 MG tablet Take 2 hours prior to CT 1 tablet 0   nitroGLYCERIN (NITROSTAT) 0.4 MG SL tablet Place 1 tablet (0.4 mg total) under the tongue every 5 (five) minutes as needed for chest pain. 90 tablet 3   omeprazole (PRILOSEC) 10 MG capsule Take 10 mg by mouth daily.     No current facility-administered medications for this visit.    Family History Family History  Problem Relation Age of Onset   Stroke Mother    Cancer Father    Stroke Brother    Autism Granddaughter       Social History Social History   Tobacco Use   Smoking status: Never   Smokeless tobacco: Never  Vaping Use   Vaping Use: Never used  Substance Use Topics   Alcohol use: Yes    Comment: on occasion   Drug use: Never         Review of Systems  Constitutional: Negative.   HENT: Negative.    Eyes: Negative.   Respiratory: Negative.    Cardiovascular: Negative.   Gastrointestinal: Negative.   Genitourinary: Negative.   Skin: Negative.   Neurological: Negative.   Psychiatric/Behavioral: Negative.       Physical Exam Blood pressure 130/76, pulse 88, temperature 98 F (36.7 C), temperature source Oral, height 6' (1.829 m), weight 241 lb 9.6 oz (109.6 kg), SpO2 98 %. Last Weight  Most recent update: 12/07/2020 10:08 AM    Weight  109.6 kg (241 lb 9.6 oz)             CONSTITUTIONAL: Well developed, and nourished, appropriately responsive and aware without distress.   EYES: Sclera non-icteric.   EARS, NOSE, MOUTH AND THROAT: Mask worn.   Hearing is intact to voice.  NECK: Trachea is midline, and there is no jugular venous distension.  LYMPH NODES:  Lymph nodes in the neck are not enlarged. RESPIRATORY:  Lungs are clear, and breath sounds are equal bilaterally. Normal respiratory effort without pathologic use of accessory muscles. CARDIOVASCULAR: Heart is regular in rate and rhythm. GI: The abdomen is  soft, nontender, and nondistended. There were no palpable masses.  I did not appreciate hepatosplenomegaly.  MUSCULOSKELETAL:  Symmetrical muscle tone appreciated in all four extremities.    SKIN: Skin turgor is normal. No pathologic skin lesions appreciated.   In the right inframammary area line there is a horizontally oriented elliptical lesion of approximately 2-1/2 cm.  There is a slight bluish discoloration over it.  No appreciable punctum to speak of.  No remarkable induration or adjacent erythema.  No fluctuance.  Seems to be dermal, rather than a subcutaneous lesion. NEUROLOGIC:  Motor and sensation appear grossly normal.  Cranial nerves are grossly without defect. PSYCH:  Alert and oriented to person, place and time. Affect is appropriate for situation.  Data Reviewed I have personally reviewed  what is currently available of the patient's imaging, recent labs and medical records.   Labs:  CBC Latest Ref Rng & Units 08/10/2020  WBC 3.4 - 10.8 x10E3/uL 5.6  Hemoglobin 13.0 - 17.7 g/dL 16.5  Hematocrit 37.5 - 51.0 % 50.7  Platelets 150 - 450 x10E3/uL 248   CMP Latest Ref Rng & Units 11/11/2020 10/14/2020 08/10/2020  Glucose 70 - 99 mg/dL 80 117(H) 92  BUN 8 - 23 mg/dL 25(H) 19 14  Creatinine 0.61 - 1.24 mg/dL 1.22 1.28(H) 1.23  Sodium 135 - 145 mmol/L 136 140 139  Potassium 3.5 - 5.1 mmol/L 4.3 4.2 4.5  Chloride 98 - 111 mmol/L 102 100 103  CO2 22 - 32 mmol/L 25 21 22   Calcium 8.9 - 10.3 mg/dL 9.3 9.3 9.2  Total Protein 6.0 - 8.5 g/dL - - 6.7  Total Bilirubin 0.0 - 1.2 mg/dL - - 0.4  Alkaline Phos 44 - 121 IU/L - - 69  AST 0 - 40 IU/L - - 20  ALT 0 - 44 IU/L - - 24      Imaging:  Within last 24 hrs: No results found.  Assessment    Right chest wall lesion, differential includes lipoma, dermal cyst, or other etiology. Patient Active Problem List   Diagnosis Date Noted   Primary hypertension 29/93/7169   Diastolic dysfunction 67/89/3810   Dizziness 08/10/2020   Dyspnea on exertion 07/08/2020   Gastroesophageal reflux disease 07/08/2020   Elevated serum creatinine 07/08/2020    Plan    Offered excision under local anesthetic and the patient desired to proceed.  Risks discussed with patient including anesthesia, bleeding, infection, recurrence etc.  I believe he desires to proceed having had his questions adequately answered.  Procedure: Patient is brought to the procedure room #9.  Right chest wall was prepped and draped in usual sterile manner.  Timeout was completed.  Local infiltration of 1% lidocaine with epi is provided to adequate anesthetic effect.  Elliptical incision is made to excise the lesion which a punctum was noted to drain during local anesthetic infiltration.  Once I got into the cyst I realized that it had been previously ruptured and that was  causing the discoloration; the cyst was unroofed.  There was no cystic wall to pursue.  The contents were expunged from the wound, the wound was irrigated.  Hemostasis was obtained.  The skin was reapproximated with interrupted buried dermal sutures of 3-0 Vicryl.  The skin was then sealed with Dermabond.  Instructions were given.  Patient tolerated procedure well having had all his questions answered.  Face-to-face time spent with the patient and accompanying care providers(if present) was 45 minutes, with more than 50% of the time spent counseling, educating, and coordinating care of the patient.    These  notes generated with voice recognition software. I apologize for typographical errors.  Ronny Bacon M.D., FACS 12/07/2020, 11:05 AM

## 2020-12-07 NOTE — Patient Instructions (Signed)
You may shower tomorrow. Please keep dry after showering. You can also take Ibuprofen and Tylenol for the pain as needed.  If you have any concerns or questions, please feel free to call our office. Follow up as needed.   Epidermoid Cyst Removal, Care After This sheet gives you information about how to care for yourself after your procedure. Your health care provider may also give you more specific instructions. If you have problems or questions, contact your health care provider. What can I expect after the procedure? After the procedure, it is common to have: Soreness in the area where your cyst was removed. Tightness or itchiness from the stitches (sutures) in your skin. Follow these instructions at home: Medicines Take over-the-counter and prescription medicines only as told by your health care provider. If you were prescribed an antibiotic medicine or ointment, take or apply it as told by your health care provider. Do not stop using the antibiotic even if you start to feel better. Incision care  Follow instructions from your health care provider about how to take care of your incision. Make sure you: Wash your hands with soap and water for at least 20 seconds before you change your bandage (dressing). If soap and water are not available, use hand sanitizer. Change your dressing as told by your health care provider. Leave sutures, skin glue, or adhesive strips in place. These skin closures may need to stay in place for 1-2 weeks or longer. If adhesive strip edges start to loosen and curl up, you may trim the loose edges. Do not remove adhesive strips completely unless your health care provider tells you to do that. Keep the dressing dry until your health care provider says that it can be removed. After your dressing is off, check your incision area every day for signs of infection. Check for: Redness, swelling, or pain. Fluid or blood. Warmth. Pus or a bad smell. General  instructions Do not take baths, swim, or use a hot tub until your health care provider approves. Ask your health care provider if you may take showers. You may only be allowed to take sponge baths. Your health care provider may ask you to avoid contact sports or activities that take a lot of effort. Do not do anything that stretches or puts pressure on your incision. You can return to your normal diet. Keep all follow-up visits. This is important. Contact a health care provider if: You have a fever. You have redness, swelling, or pain in the incision area. You have fluid or blood coming from your incision. You have pus or a bad smell coming from your incision. Your incision feels warm to the touch. Your cyst grows back. Get help right away if: If the incision site suddenly increases in size and you have pain at the incision site. You may be checked for a collection of blood under the skin from the procedure (hematoma). Summary After the procedure, it is common to have soreness in the area where your cyst was removed. Take or apply over-the-counter and prescription medicines only as told by your health care provider. Follow instructions from your health care provider about how to take care of your incision. This information is not intended to replace advice given to you by your health care provider. Make sure you discuss any questions you have with your health care provider. Document Revised: 04/16/2019 Document Reviewed: 04/16/2019 Elsevier Patient Education  [place].

## 2020-12-31 ENCOUNTER — Telehealth: Payer: Self-pay | Admitting: Nurse Practitioner

## 2020-12-31 NOTE — Telephone Encounter (Signed)
Copied from [place] [id number]. Topic: Medicare AWV >> Dec 31, 2020 12:36 PM Lavonia Drafts wrote: Reason for CRM: Left message for patient to call back and schedule Medicare Annual Wellness Visit (AWV) to be done virtually or by telephone.  No hx of AWV eligible as of 01/24/15  Please schedule at anytime with [facility] Health Advisor.      84 Minutes appointment   Any questions, please call me at [phone]

## 2021-01-06 ENCOUNTER — Encounter: Payer: Self-pay | Admitting: Nurse Practitioner

## 2021-01-11 NOTE — Progress Notes (Signed)
Established Patient Office Visit  Subjective:  Patient ID: Juan West, male    DOB: 22-May-1950  Age: 70 y.o. MRN: [id number]  CC:  Chief Complaint  Patient presents with   Hypertension   Diarrhea    Had a stomach bug last month and stools are still loose.    HPI Juan West presents for follow up on hypertension and ongoing loose stools. He states that he had a stomach bug last month and his stools are still loose and frequent. He has not changed his diet recently. He does endorse increased stress.   HYPERTENSION  Hypertension status: controlled  Satisfied with current treatment? yes Duration of hypertension: months BP medication side effects:  no Medication compliance: excellent compliance Previous BP meds:lisinopril, hctz Aspirin: no Recurrent headaches: no Visual changes: no Palpitations: no Dyspnea: yes - with extreme exertion Chest pain: no Lower extremity edema: no Dizzy/lightheaded: yes - intermittent   Past Medical History:  Diagnosis Date   Allergy    Cancer ([place])    GERD (gastroesophageal reflux disease)     Past Surgical History:  Procedure Laterality Date   BASAL CELL CARCINOMA EXCISION     HERNIA REPAIR     NO PAST SURGERIES     PTERYGIUM EXCISION     SQUAMOUS CELL CARCINOMA EXCISION      Family History  Problem Relation Age of Onset   Stroke Mother    Cancer Father    Stroke Brother    Autism Granddaughter     Social History   Socioeconomic History   Marital status: Divorced    Spouse name: Not on file   Number of children: Not on file   Years of education: Not on file   Highest education level: Not on file  Occupational History   Not on file  Tobacco Use   Smoking status: Never   Smokeless tobacco: Never  Vaping Use   Vaping Use: Never used  Substance and Sexual Activity   Alcohol use: Yes    Comment: on occasion   Drug use: Never   Sexual activity: Not Currently  Other Topics Concern   Not on file  Social  History Narrative   Not on file   Social Determinants of Health   Financial Resource Strain: Not on file  Food Insecurity: Not on file  Transportation Needs: Not on file  Physical Activity: Not on file  Stress: Not on file  Social Connections: Not on file  Intimate Partner Violence: Not on file    Outpatient Medications Prior to Visit  Medication Sig Dispense Refill   lisinopril-hydrochlorothiazide (ZESTORETIC) 10-12.5 MG tablet Take 1 tablet by mouth daily. 90 tablet 1   omeprazole (PRILOSEC) 10 MG capsule Take 10 mg by mouth daily.     metoprolol tartrate (LOPRESSOR) 100 MG tablet Take 2 hours prior to CT 1 tablet 0   nitroGLYCERIN (NITROSTAT) 0.4 MG SL tablet Place 1 tablet (0.4 mg total) under the tongue every 5 (five) minutes as needed for chest pain. (Patient not taking: Reported on 01/13/2021) 90 tablet 3   No facility-administered medications prior to visit.    Allergies  Allergen Reactions   Codeine Other (See Comments)    hallucinations   Melatonin Other (See Comments)    Drowsiness    Trazodone And Nefazodone Nausea And Vomiting    ROS Review of Systems  Constitutional:  Positive for fatigue.  Respiratory:  Positive for shortness of breath (with exertion). Negative for cough and chest tightness.  Cardiovascular: Negative.   Gastrointestinal:  Positive for diarrhea. Negative for abdominal pain, nausea and vomiting.  Genitourinary: Negative.   Neurological:  Positive for dizziness (when changing positions).     Objective:    Physical Exam Vitals and nursing note reviewed.  Constitutional:      Appearance: Normal appearance.  HENT:     Head: Normocephalic.  Cardiovascular:     Rate and Rhythm: Normal rate and regular rhythm.     Pulses: Normal pulses.     Heart sounds: Normal heart sounds.  Pulmonary:     Effort: Pulmonary effort is normal.     Breath sounds: Normal breath sounds.  Musculoskeletal:     Cervical back: Normal range of motion.   Skin:    General: Skin is warm and dry.  Neurological:     General: No focal deficit present.     Mental Status: He is alert and oriented to person, place, and time.  Psychiatric:        Mood and Affect: Mood normal.        Behavior: Behavior normal.        Thought Content: Thought content normal.        Judgment: Judgment normal.    BP 127/68    Pulse 84    Temp 98.4 F (36.9 C) (Oral)    Ht 6' 0.01" (1.829 m)    Wt 240 lb 12.8 oz (109.2 kg)    SpO2 96%    BMI 32.65 kg/m  Wt Readings from Last 3 Encounters:  01/13/21 240 lb 12.8 oz (109.2 kg)  12/07/20 241 lb 9.6 oz (109.6 kg)  11/25/20 240 lb 6 oz (109 kg)     There are no preventive care reminders to display for this patient.   There are no preventive care reminders to display for this patient.  Lab Results  Component Value Date   TSH 1.870 08/10/2020   Lab Results  Component Value Date   WBC 5.6 08/10/2020   HGB 16.5 08/10/2020   HCT 50.7 08/10/2020   MCV 89 08/10/2020   PLT 248 08/10/2020   Lab Results  Component Value Date   NA 136 11/11/2020   K 4.3 11/11/2020   CO2 25 11/11/2020   GLUCOSE 80 11/11/2020   BUN 25 (H) 11/11/2020   CREATININE 1.22 11/11/2020   BILITOT 0.4 08/10/2020   ALKPHOS 69 08/10/2020   AST 20 08/10/2020   ALT 24 08/10/2020   PROT 6.7 08/10/2020   ALBUMIN 4.6 08/10/2020   CALCIUM 9.3 11/11/2020   ANIONGAP 9 11/11/2020   EGFR 60 10/14/2020   Lab Results  Component Value Date   CHOL 144 05/19/2020   Lab Results  Component Value Date   HDL 32 (A) 05/19/2020   Lab Results  Component Value Date   LDLCALC 93 05/19/2020   Lab Results  Component Value Date   TRIG 102 05/19/2020   No results found for: CHOLHDL No results found for: HGBA1C    Assessment & Plan:   Problem List Items Addressed This Visit       Cardiovascular and Mediastinum   Primary hypertension - Primary    Chronic, stable. Continue current regimen. Recent labs from October 2022 reviewed. Does not  need refill at this time. Follow up in 6 months.         Other   Dyspnea on exertion    Has seen pulmonology and cardiology. Cardiac CT with calcium score 0. Most likely due to deconditioning and  weight. Call if any changes in symptoms or concerns.       Other Visit Diagnoses     Diarrhea, unspecified type       Ongoing loose stools after stomach bug. Discussed recent stress and he can increase fiber in his foods, take a supplement if needed, and start probiotic daily.        No orders of the defined types were placed in this encounter.   Follow-up: Return in about 6 months (around 07/14/2021) for HTN with Santiago Glad.    Charyl Dancer, NP

## 2021-01-13 ENCOUNTER — Ambulatory Visit (INDEPENDENT_AMBULATORY_CARE_PROVIDER_SITE_OTHER): Payer: Medicare Other | Admitting: Nurse Practitioner

## 2021-01-13 ENCOUNTER — Other Ambulatory Visit: Payer: Self-pay

## 2021-01-13 ENCOUNTER — Encounter: Payer: Self-pay | Admitting: Nurse Practitioner

## 2021-01-13 VITALS — BP 127/68 | HR 84 | Temp 98.4°F | Ht 72.01 in | Wt 240.8 lb

## 2021-01-13 DIAGNOSIS — R0609 Other forms of dyspnea: Secondary | ICD-10-CM | POA: Diagnosis not present

## 2021-01-13 DIAGNOSIS — R197 Diarrhea, unspecified: Secondary | ICD-10-CM | POA: Diagnosis not present

## 2021-01-13 DIAGNOSIS — I10 Essential (primary) hypertension: Secondary | ICD-10-CM | POA: Diagnosis not present

## 2021-01-13 NOTE — Patient Instructions (Addendum)
Increase the amount of fiber you are eating in foods You can also start metamucil or benefiber supplement You can also start probiotic daily  Boys visit Tuesday 12/27 at 8:40am

## 2021-01-13 NOTE — Assessment & Plan Note (Signed)
Has seen pulmonology and cardiology. Cardiac CT with calcium score 0. Most likely due to deconditioning and weight. Call if any changes in symptoms or concerns.

## 2021-01-13 NOTE — Assessment & Plan Note (Signed)
Chronic, stable. Continue current regimen. Recent labs from October 2022 reviewed. Does not need refill at this time. Follow up in 6 months.

## 2021-02-11 ENCOUNTER — Other Ambulatory Visit: Payer: Self-pay

## 2021-02-11 MED ORDER — OMEPRAZOLE 20 MG PO CPDR: 20.0000 mg | DELAYED_RELEASE_CAPSULE | Freq: Every day | ORAL | 1 refills | Status: DC

## 2021-03-16 ENCOUNTER — Encounter: Payer: Self-pay | Admitting: Cardiology

## 2021-03-16 ENCOUNTER — Other Ambulatory Visit: Payer: Self-pay

## 2021-03-16 ENCOUNTER — Ambulatory Visit: Payer: Medicare Other | Admitting: Cardiology

## 2021-03-16 NOTE — Progress Notes (Signed)
Pt left before being seen by provider.

## 2021-04-11 ENCOUNTER — Telehealth (INDEPENDENT_AMBULATORY_CARE_PROVIDER_SITE_OTHER): Payer: Medicare Other | Admitting: Nurse Practitioner

## 2021-04-11 ENCOUNTER — Encounter: Payer: Self-pay | Admitting: Nurse Practitioner

## 2021-04-11 VITALS — Temp 98.2°F

## 2021-04-11 DIAGNOSIS — J069 Acute upper respiratory infection, unspecified: Secondary | ICD-10-CM

## 2021-04-11 DIAGNOSIS — R319 Hematuria, unspecified: Secondary | ICD-10-CM

## 2021-04-11 MED ORDER — METHYLPREDNISOLONE 4 MG PO TBPK: ORAL_TABLET | ORAL | 0 refills | Status: DC

## 2021-04-11 MED ORDER — AZITHROMYCIN 250 MG PO TABS: ORAL_TABLET | ORAL | 0 refills | Status: AC

## 2021-04-11 NOTE — Addendum Note (Signed)
Addended by: Jon Billings on: 04/11/2021 04:31 PM ? ? Modules accepted: Orders ? ?

## 2021-04-11 NOTE — Progress Notes (Signed)
? ?Temp 98.2 ?F (36.8 ?C) (Temporal)   ? ?Subjective:  ? ? Patient ID: Juan West, male    DOB: 04/05/1950, 72 y.o.   MRN: [id number] ? ?HPI: ?Juan West is a 71 y.o. male ? ?Chief Complaint  ?Patient presents with  ? Allergic Rhinitis   ?  Pt states he has been having a dry cough and chest congestion for the past week. States he feels like his cetirizine is not working for him.   ? ?ALLERGIES ?Duration: days (7 days) ?Runny nose: no  ?Nasal congestion:  mostly at night when he lies down ?Nasal itching: no ?Sneezing: no ?Eye swelling, itching or discharge:  eyes are itching ?Post nasal drip: no ?Cough: yes ?Sinus pressure: yes  ?Ear pain: yes bilateral ?Ear pressure: yes bilateral ?Fever: no  ?Satisfied with current treatment: no ?Allergist evaluation in past: no ?Allergen injection immunotherapy: no ?Current allergy medications: zyrtec ?Treatments attempted: zyrtec ? ?Patient states he had some bleeding from his penis.  He urinated over night and had blood in the toilet.  He has not had any pain or fever.  Has not had any blood during the day.   ? ?Relevant past medical, surgical, family and social history reviewed and updated as indicated. Interim medical history since our last visit reviewed. ?Allergies and medications reviewed and updated. ? ?Review of Systems  ?Constitutional:  Negative for fatigue and fever.  ?HENT:  Positive for congestion, ear pain, sinus pressure and sinus pain. Negative for postnasal drip, rhinorrhea, sneezing and sore throat.   ?Respiratory:  Positive for cough. Negative for chest tightness, shortness of breath and wheezing.   ?Gastrointestinal:  Negative for vomiting.  ?Skin:  Negative for rash.  ?Neurological:  Negative for headaches.  ? ?Per HPI unless specifically indicated above ? ?   ?Objective:  ?  ?Temp 98.2 ?F (36.8 ?C) (Temporal)   ?Wt Readings from Last 3 Encounters:  ?03/16/21 243 lb 3.2 oz (110.3 kg)  ?01/13/21 240 lb 12.8 oz (109.2 kg)  ?12/07/20 241 lb 9.6 oz  (109.6 kg)  ?  ?Physical Exam ?Vitals and nursing note reviewed.  ?Constitutional:   ?   General: He is not in acute distress. ?   Appearance: He is not ill-appearing.  ?HENT:  ?   Head: Normocephalic.  ?   Right Ear: Hearing normal.  ?   Left Ear: Hearing normal.  ?   Nose: Nose normal.  ?Pulmonary:  ?   Effort: Pulmonary effort is normal. No respiratory distress.  ?Neurological:  ?   Mental Status: He is alert.  ?Psychiatric:     ?   Mood and Affect: Mood normal.     ?   Behavior: Behavior normal.     ?   Thought Content: Thought content normal.     ?   Judgment: Judgment normal.  ? ? ?Results for orders placed or performed during the hospital encounter of 11/11/20  ?Basic metabolic panel  ?Result Value Ref Range  ? Sodium 136 135 - 145 mmol/L  ? Potassium 4.3 3.5 - 5.1 mmol/L  ? Chloride 102 98 - 111 mmol/L  ? CO2 25 22 - 32 mmol/L  ? Glucose, Bld 80 70 - 99 mg/dL  ? BUN 25 (H) 8 - 23 mg/dL  ? Creatinine, Ser 1.22 0.61 - 1.24 mg/dL  ? Calcium 9.3 8.9 - 10.3 mg/dL  ? GFR, Estimated >60 >60 mL/min  ? Anion gap 9 5 - 15  ?Magnesium  ?Result Value Ref  Range  ? Magnesium 2.0 1.7 - 2.4 mg/dL  ? ?   ?Assessment & Plan:  ? ?Problem List Items Addressed This Visit   ?None ?Visit Diagnoses   ? ? Viral upper respiratory tract infection    -  Primary  ? Relevant Medications  ? azithromycin (ZITHROMAX) 250 MG tablet  ? Hematuria, unspecified type      ? Will check labs to rule out infection.  Will make recommendations based on lbas.  May need to refer back to Urology for further evaluation.  ? Relevant Orders  ? Urinalysis, Routine w reflex microscopic  ? Urine Culture  ? PSA  ? ?  ?  ? ?Follow up plan: ?Return if symptoms worsen or fail to improve. ? ?This visit was completed via MyChart due to the restrictions of the COVID-19 pandemic. All issues as above were discussed and addressed. Physical exam was done as above through visual confirmation on MyChart. If it was felt that the patient should be evaluated in the office,  they were directed there. The patient verbally consented to this visit. ?Location of the patient: Home ?Location of the provider: Office ?Those involved with this call:  ?Provider: Jon Billings, NP ?CMA: Yvonna Alanis, CMA ?Front Desk/Registration: Lynnell Catalan ?This encounter was conducted via video.  I spent 20 dedicated to the care of this patient on the date of this encounter to include previsit review of symptoms and plan of care, face to face time with the patient, and post visit ordering of testing.  ? ? ? ? ? ?

## 2021-04-12 ENCOUNTER — Other Ambulatory Visit: Payer: Self-pay

## 2021-04-12 ENCOUNTER — Encounter: Payer: Self-pay | Admitting: Nurse Practitioner

## 2021-04-12 ENCOUNTER — Other Ambulatory Visit: Payer: Self-pay | Admitting: Nurse Practitioner

## 2021-04-12 ENCOUNTER — Other Ambulatory Visit: Payer: Medicare Other

## 2021-04-12 DIAGNOSIS — R319 Hematuria, unspecified: Secondary | ICD-10-CM | POA: Diagnosis not present

## 2021-04-12 LAB — URINALYSIS, ROUTINE W REFLEX MICROSCOPIC
Bilirubin, UA: NEGATIVE
Glucose, UA: NEGATIVE
Leukocytes,UA: NEGATIVE
Nitrite, UA: NEGATIVE
Protein,UA: NEGATIVE
Specific Gravity, UA: 1.025 (ref 1.005–1.030)
Urobilinogen, Ur: 0.2 mg/dL (ref 0.2–1.0)
pH, UA: 5.5 (ref 5.0–7.5)

## 2021-04-12 LAB — MICROSCOPIC EXAMINATION
Epithelial Cells (non renal): NONE SEEN /hpf (ref 0–10)
WBC, UA: NONE SEEN /hpf (ref 0–5)

## 2021-04-13 ENCOUNTER — Other Ambulatory Visit (HOSPITAL_COMMUNITY): Payer: Self-pay

## 2021-04-13 LAB — COMPREHENSIVE METABOLIC PANEL
ALT: 35 IU/L (ref 0–44)
AST: 25 IU/L (ref 0–40)
Albumin/Globulin Ratio: 2 (ref 1.2–2.2)
Albumin: 4.9 g/dL — ABNORMAL HIGH (ref 3.7–4.7)
Alkaline Phosphatase: 81 IU/L (ref 44–121)
BUN/Creatinine Ratio: 18 (ref 10–24)
BUN: 25 mg/dL (ref 8–27)
Bilirubin Total: 0.4 mg/dL (ref 0.0–1.2)
CO2: 21 mmol/L (ref 20–29)
Calcium: 9.9 mg/dL (ref 8.6–10.2)
Chloride: 97 mmol/L (ref 96–106)
Creatinine, Ser: 1.41 mg/dL — ABNORMAL HIGH (ref 0.76–1.27)
Globulin, Total: 2.4 g/dL (ref 1.5–4.5)
Glucose: 116 mg/dL — ABNORMAL HIGH (ref 70–99)
Potassium: 4.2 mmol/L (ref 3.5–5.2)
Sodium: 137 mmol/L (ref 134–144)
Total Protein: 7.3 g/dL (ref 6.0–8.5)
eGFR: 53 mL/min/{1.73_m2} — ABNORMAL LOW (ref >59)

## 2021-04-13 LAB — CBC WITH DIFFERENTIAL/PLATELET
Basophils Absolute: 0.1 10*3/uL (ref 0.0–0.2)
Basos: 1 %
EOS (ABSOLUTE): 0.2 10*3/uL (ref 0.0–0.4)
Eos: 3 %
Hematocrit: 53 % — ABNORMAL HIGH (ref 37.5–51.0)
Hemoglobin: 18.6 g/dL — ABNORMAL HIGH (ref 13.0–17.7)
Immature Grans (Abs): 0.1 10*3/uL (ref 0.0–0.1)
Immature Granulocytes: 1 %
Lymphocytes Absolute: 2.1 10*3/uL (ref 0.7–3.1)
Lymphs: 26 %
MCH: 30.6 pg (ref 26.6–33.0)
MCHC: 35.1 g/dL (ref 31.5–35.7)
MCV: 87 fL (ref 79–97)
Monocytes Absolute: 0.3 10*3/uL (ref 0.1–0.9)
Monocytes: 4 %
Neutrophils Absolute: 5.1 10*3/uL (ref 1.4–7.0)
Neutrophils: 65 %
Platelets: 322 10*3/uL (ref 150–450)
RBC: 6.07 x10E6/uL — ABNORMAL HIGH (ref 4.14–5.80)
RDW: 12.6 % (ref 11.6–15.4)
WBC: 7.9 10*3/uL (ref 3.4–10.8)

## 2021-04-13 LAB — PSA: Prostate Specific Ag, Serum: 4.5 ng/mL — ABNORMAL HIGH (ref 0.0–4.0)

## 2021-04-13 MED ORDER — CETIRIZINE HCL 10 MG PO TABS
10.0000 mg | ORAL_TABLET | Freq: Every day | ORAL | 1 refills | Status: DC
  Filled 2021-04-13 (×2): qty 90, 90d supply, fill #0

## 2021-04-13 NOTE — Progress Notes (Signed)
Please let patient know that his lab work from yesterday shows that he still has blood in his urine and his kidney function increased again.  I am going to refer him to Urology for further evaluation.  He may have a kidney stone and they will do more imaging to evaluate the kidneys as well as look for a stone.

## 2021-04-15 ENCOUNTER — Telehealth: Payer: Medicare Other | Admitting: Nurse Practitioner

## 2021-04-15 ENCOUNTER — Ambulatory Visit: Payer: Self-pay | Admitting: *Deleted

## 2021-04-15 LAB — URINE CULTURE: Organism ID, Bacteria: NO GROWTH

## 2021-04-15 NOTE — Telephone Encounter (Signed)
?  Chief Complaint: Pt concerned about grandson coming home "high as a kite yesterday" and wants him drug tested. ?Symptoms: Grandson had on sunglasses in house and avoiding [place].   "They had it out" when grandson was confronted. ?Frequency: Last night ?Pertinent Negatives: Patient denies N/A ?Disposition: '[]'$ ED /'[]'$ Urgent Care (no appt availability in office) / '[x]'$ Appointment(In office/virtual)/ '[]'$  [facility]/ '[]'$ Home Care/ '[]'$ Refused Recommended Disposition /'[]'$ [facility]/ '[]'$  Follow-up with PCP ?Additional Notes: Call warm transferred into the office to [facility].  ?

## 2021-04-15 NOTE — Progress Notes (Signed)
Please let patient know that there was no growth on his urine culture.  I recommend he see Urology as previously discussed this week.

## 2021-04-15 NOTE — Telephone Encounter (Signed)
Pt calling in with question.You have 3 patients.  [name] on adderall and the other on a different med. ? ?Juan West came home high as a kite last night.   I'm scared to death.   I don't think it was adderall.   He tried to cover it.  He took an extra adderall.    ? ?I want a drug test on him.   I'm so distressed.  I've had one son die from drug over dose.   I don't want another death.   ? ? ?Reason for Disposition ? [1] Caller has URGENT medicine question about med that PCP or specialist prescribed AND [2] triager unable to answer question ? ?Answer Assessment - Initial Assessment Questions ?1. REASON FOR CALL or QUESTION: "What is your reason for calling today?" or "How can I best help you?" or "What question do you have that I can help answer?" ?    Pt calling in concerned about his grandson Juan West being high on drugs last night.   Wants him drug tested.    ? ?I warm transferred the call to [place] in office.  He was scheduled for today at 2:00 with Jon Billings, NP for grandson Juan West. ? ?Answer Assessment - Initial Assessment Questions ?1. NAME of MEDICATION: "What medicine are you calling about?" ?    Adderall ?2. QUESTION: "What is your question?" (e.g., double dose of medicine, side effect) ?    My grandson came home last night high as a kite and says it was just his adderall.  Pt requesting Juan West be drug tested.  ?Call warm transferred into office for scheduling.   ?3. PRESCRIBING HCP: "Who prescribed it?" Reason: if prescribed by specialist, call should be referred to that group. ?    *No Answer* ?4. SYMPTOMS: "Do you have any symptoms?" ?    *No Answer* ?5. SEVERITY: If symptoms are present, ask "Are they mild, moderate or severe?" ?    *No Answer* ?6. PREGNANCY:  "Is there any chance that you are pregnant?" "When was your last menstrual period?" ?    *No Answer* ? ?Protocols used: Information Only Call - No Triage-A-AH, Medication Question Call-A-AH ? ?

## 2021-04-25 NOTE — Progress Notes (Incomplete)
? ?04/25/21 ?2:50 PM  ? ?Juan West ?May 24, 1950 ?[id number] ? ?Referring provider:  ?Jon Billings, NP ?[address] ?[place],  [place] [address] ?No chief complaint on file. ? ? ? ?HPI: ?Juan West is a 71 y.o.male with a personal history of elevated PSA ,who presents today for further evaluation of hematuria.  ? ?He was last seen in clinic in 2020 ? ?His most recent PSA was 4.5 on 04/12/2021 with a previous PSA o 3.7 on 11/14/2018. ? ? ?PMH: ?Past Medical History:  ?Diagnosis Date  ? Allergy   ? Cancer [facility])   ? GERD (gastroesophageal reflux disease)   ? ? ?Surgical History: ?Past Surgical History:  ?Procedure Laterality Date  ? BASAL CELL CARCINOMA EXCISION    ? HERNIA REPAIR    ? NO PAST SURGERIES    ? PTERYGIUM EXCISION    ? SQUAMOUS CELL CARCINOMA EXCISION    ? ? ?Home Medications:  ?Allergies as of 04/26/2021   ? ?   Reactions  ? Codeine Other (See Comments)  ? hallucinations  ? Melatonin Other (See Comments)  ? Drowsiness  ? Trazodone And Nefazodone Nausea And Vomiting  ? ?  ? ?  ?Medication List  ?  ? ?  ? Accurate as of April 25, 2021  2:50 PM. If you have any questions, ask your nurse or doctor.  ?  ?  ? ?  ? ?cetirizine 10 MG tablet ?Commonly known as: ZYRTEC ?Take 1 tablet (10 mg total) by mouth daily. ?  ?lisinopril-hydrochlorothiazide 10-12.5 MG tablet ?Commonly known as: ZESTORETIC ?Take 1 tablet by mouth daily. ?  ?methylPREDNISolone 4 MG Tbpk tablet ?Commonly known as: MEDROL DOSEPAK ?Take as directed ?  ?nitroGLYCERIN 0.4 MG SL tablet ?Commonly known as: NITROSTAT ?Place 1 tablet (0.4 mg total) under the tongue every 5 (five) minutes as needed for chest pain. ?  ?omeprazole 10 MG capsule ?Commonly known as: PRILOSEC ?Take 10 mg by mouth daily. ?  ? ?  ? ? ?Allergies:  ?Allergies  ?Allergen Reactions  ? Codeine Other (See Comments)  ?  hallucinations  ? Melatonin Other (See Comments)  ?  Drowsiness ?  ? Trazodone And Nefazodone Nausea And Vomiting  ? ? ?Family History: ?Family History   ?Problem Relation Age of Onset  ? Stroke Mother   ? Cancer Father   ? Stroke Brother   ? Autism Granddaughter   ? ? ?Social History:  reports that he has never smoked. He has never used smokeless tobacco. He reports current alcohol use. He reports that he does not use drugs. ? ? ?Physical Exam: ?There were no vitals taken for this visit.  ?Constitutional:  Alert and oriented, No acute distress. ?HEENT: [place] AT, moist mucus membranes.  Trachea midline, no masses. ?Cardiovascular: No clubbing, cyanosis, or edema. ?Respiratory: Normal respiratory effort, no increased work of breathing. ?Skin: No rashes, bruises or suspicious lesions. ?Neurologic: Grossly intact, no focal deficits, moving all 4 extremities. ?Psychiatric: Normal mood and affect. ? ?Laboratory Data: ? ?Lab Results  ?Component Value Date  ? CREATININE 1.41 (H) 04/12/2021  ? ? ?Urinalysis ? ? ?Pertinent Imaging: ? ? ? ?Assessment & Plan:   ? ? ?No follow-ups on file. ? ?I,Kailey Littlejohn,acting as a scribe for Hollice Espy, MD.,have documented all relevant documentation on the behalf of Hollice Espy, MD,as directed by  Hollice Espy, MD while in the presence of Hollice Espy, MD. ? ? ?[facility] ?[address] ?[place],  [address] ?[phone]  [phone] ? ?

## 2021-04-26 ENCOUNTER — Ambulatory Visit: Payer: Medicare Other | Admitting: Urology

## 2021-04-27 ENCOUNTER — Encounter: Payer: Self-pay | Admitting: Urology

## 2021-05-03 ENCOUNTER — Ambulatory Visit: Payer: Medicare Other | Admitting: Urology

## 2021-05-09 NOTE — Progress Notes (Signed)
? ?05/11/2021 ?3:18 PM  ? ?Andrik Sandt Qualls ?[birth year] ?[id number] ? ?Referring provider:  ?Jon Billings, NP ?[address] ?[place],  [place] [address] ?Chief Complaint  ?Patient presents with  ? Hematuria  ? ? ?HPI: ?Juan West is a 71 y.o.male with a personal history of peyronie's disease and elevated PSA , who presents today for further evaluation of hematuria.  ? ?He underwent routine annual PSA screening on 04/04/2018 which time his PSA was noted to be 3.48.  This was up from the previous year on 02/09/2017 at which time his PSA was 2.56. His most recent PSA was 4.5 on 04/12/2021.  ? ?UA on 04/12/2021 had 3-10 RBCs but otherwise unremarkable culture.  ? ?UA today was unremarkable.  ? ?He reports today that he had an episode of gross hematuria he had never experienced this before. The hematuria was ongoing for a few days.  It was not associated with any lower urinary tract symptoms including dysuria urgency or frequency. ? 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  ? ? Prostate Specific Ag, Serum  ?Latest Ref Rng 0.0 - 4.0 ng/mL  ?02/09/2017 2.56  ?04/04/2018 3.48  ?08/09/2018 4.3 (H)   ?11/14/2018 3.7   ?04/12/2021 4.5 (H)   ?  ?(H) High ? ? ?PMH: ?Past Medical History:  ?Diagnosis Date  ? Allergy   ? Cancer [facility])   ? GERD (gastroesophageal reflux disease)   ? ? ?Surgical History: ?Past Surgical History:  ?Procedure Laterality Date  ? BASAL CELL CARCINOMA EXCISION    ? HERNIA REPAIR    ? NO PAST SURGERIES    ? PTERYGIUM EXCISION    ? SQUAMOUS CELL CARCINOMA EXCISION    ? ? ?Home Medications:  ?Allergies as of 05/11/2021   ? ?   Reactions  ? Codeine Other (See Comments)  ? hallucinations  ? Melatonin Other (See Comments)  ? Drowsiness  ? Trazodone And Nefazodone Nausea And Vomiting  ? ?  ? ?  ?Medication List  ?  ? ?  ? Accurate as of May 11, 2021 11:59 PM. If you have any questions, ask your nurse or doctor.  ?  ?  ? ?  ? ?STOP taking these medications   ? ?methylPREDNISolone 4 MG Tbpk  tablet ?Commonly known as: MEDROL DOSEPAK ?Stopped by: Hollice Espy, MD ?  ?nitroGLYCERIN 0.4 MG SL tablet ?Commonly known as: NITROSTAT ?Stopped by: Hollice Espy, MD ?  ? ?  ? ?TAKE these medications   ? ?cetirizine 10 MG tablet ?Commonly known as: ZYRTEC ?Take 1 tablet (10 mg total) by mouth daily. ?  ?lisinopril-hydrochlorothiazide 10-12.5 MG tablet ?Commonly known as: ZESTORETIC ?Take 1 tablet by mouth daily. ?  ?omeprazole 10 MG capsule ?Commonly known as: PRILOSEC ?Take 1 capsule (10 mg total) by mouth daily. ?  ? ?  ? ? ?Allergies:  ?Allergies  ?Allergen Reactions  ? Codeine Other (See Comments)  ?  hallucinations  ? Melatonin Other (See Comments)  ?  Drowsiness ?  ? Trazodone And Nefazodone Nausea And Vomiting  ? ? ?Family History: ?Family History  ?Problem Relation Age of Onset  ? Stroke Mother   ? Cancer Father   ? Stroke Brother   ? Autism Granddaughter   ? ? ?Social History:  reports that he has never smoked. He has never used smokeless tobacco. He reports current alcohol use. He reports that he does not use drugs. ? ? ?Physical Exam: ?BP 133/87   Pulse 66  Ht 6' (1.829 m)   Wt 235 lb (106.6 kg)   BMI 31.87 kg/m?   ?Constitutional:  Alert and oriented, No acute distress. ?HEENT: [place] AT, moist mucus membranes.  Trachea midline, no masses. ?Cardiovascular: No clubbing, cyanosis, or edema. ?Respiratory: Normal respiratory effort, no increased work of breathing. ?Rectal: Normal sphincter tone,  45 CC prostate, smooth no nodules left ridge firm and irregular  ?Skin: No rashes, bruises or suspicious lesions. ?Neurologic: Grossly intact, no focal deficits, moving all 4 extremities. ?Psychiatric: Normal mood and affect. ? ?Laboratory Data: ?Lab Results  ?Component Value Date  ? CREATININE 1.41 (H) 04/12/2021  ?  ? ?Urinalysis ?Unremarkable today  ? ? ?Assessment & Plan:   ? ?Gross hematuria/ microscopic hematuria   ?- We discussed the differential diagnosis for microscopic hematuria ?including  nephrolithiasis, renal or upper tract tumors, bladder stones, UTIs, or bladder tumors as well as undetermined etiologies. ?- Per AUA guidelines, I did recommend complete microscopic hematuria evaluation including CTU, possible urine cytology, and office cystoscopy. ? ? ?2. Elevated PSA/ abnormal rectal exam  ?- Will continue monitoring with PSA in 6 months  ?- In light of abnormal rectal exam today recommend a prostate biopsy at time of cytoscopy to rule out any malignancy.  ?-We discussed prostate biopsy in detail including the procedure itself, the risks of blood in the urine, stool, and ejaculate, serious infection, and discomfort. He is willing to proceed with this as discussed. ? ? ?Cystoscopy/prostate biopsy ? ?Conley Rolls as a Education administrator for Hollice Espy, MD.,have documented all relevant documentation on the behalf of Hollice Espy, MD,as directed by  Hollice Espy, MD while in the presence of Hollice Espy, MD. ? ?I have reviewed the above documentation for accuracy and completeness, and I agree with the above.  ? ?Hollice Espy, MD ? ? ?[facility] ?[address], [address] ?[place], [place] [address] ?[phone] ?

## 2021-05-11 ENCOUNTER — Ambulatory Visit: Payer: Medicare Other | Admitting: Urology

## 2021-05-11 VITALS — BP 133/87 | HR 66 | Ht 72.0 in | Wt 235.0 lb

## 2021-05-11 DIAGNOSIS — R972 Elevated prostate specific antigen [PSA]: Secondary | ICD-10-CM | POA: Diagnosis not present

## 2021-05-11 DIAGNOSIS — R319 Hematuria, unspecified: Secondary | ICD-10-CM

## 2021-05-11 LAB — URINALYSIS, COMPLETE
Bilirubin, UA: NEGATIVE
Glucose, UA: NEGATIVE
Ketones, UA: NEGATIVE
Leukocytes,UA: NEGATIVE
Nitrite, UA: NEGATIVE
Protein,UA: NEGATIVE
RBC, UA: NEGATIVE
Specific Gravity, UA: 1.02 (ref 1.005–1.030)
Urobilinogen, Ur: 0.2 mg/dL (ref 0.2–1.0)
pH, UA: 6 (ref 5.0–7.5)

## 2021-05-11 LAB — MICROSCOPIC EXAMINATION: Bacteria, UA: NONE SEEN

## 2021-05-11 MED ORDER — LISINOPRIL-HYDROCHLOROTHIAZIDE 10-12.5 MG PO TABS: 1.0000 | ORAL_TABLET | Freq: Every day | ORAL | 1 refills | Status: DC

## 2021-05-11 MED ORDER — OMEPRAZOLE 10 MG PO CPDR: 10.0000 mg | DELAYED_RELEASE_CAPSULE | Freq: Every day | ORAL | 1 refills | Status: DC

## 2021-05-11 NOTE — Patient Instructions (Addendum)
Cystoscopy ?Cystoscopy is a procedure that is used to help diagnose and sometimes treat conditions that affect the lower urinary tract. The lower urinary tract includes the bladder and the urethra. The urethra is the tube that drains urine from the bladder. Cystoscopy is done using a thin, tube-shaped instrument with a light and camera at the end (cystoscope). The cystoscope may be hard or flexible, depending on the goal of the procedure. The cystoscope is inserted through the urethra, into the bladder. ?Cystoscopy may be recommended if you have: ?Urinary tract infections that keep coming back. ?Blood in the urine (hematuria). ?An inability to control when you urinate (urinary incontinence) or an overactive bladder. ?Unusual cells found in a urine sample. ?A blockage in the urethra, such as a urinary stone. ?Painful urination. ?An abnormality in the bladder found during an intravenous pyelogram (IVP) or CT scan. ?What are the risks? ?Generally, this is a safe procedure. However, problems may occur, including: ?Infection. ?Bleeding. ? ?What happens during the procedure? ? ?You will be given one or more of the following: ?A medicine to numb the area (local anesthetic). ?The area around the opening of your urethra will be cleaned. ?The cystoscope will be passed through your urethra into your bladder. ?Germ-free (sterile) fluid will flow through the cystoscope to fill your bladder. The fluid will stretch your bladder so that your health care provider can clearly examine your bladder walls. ?Your doctor will look at the urethra and bladder. ?The cystoscope will be removed ?The procedure may vary among health care providers  ?What can I expect after the procedure? ?After the procedure, it is common to have: ?Some soreness or pain in your urethra. ?Urinary symptoms. These include: ?Mild pain or burning when you urinate. Pain should stop within a few minutes after you urinate. This may last for up to a few days after the  procedure. ?A small amount of blood in your urine for several days. ?Feeling like you need to urinate but producing only a small amount of urine. ?Follow these instructions at home: ?General instructions ?Return to your normal activities as told by your health care provider.  ?Drink plenty of fluids after the procedure. ?Keep all follow-up visits as told by your health care provider. This is important. ?Contact a health care provider if you: ?Have pain that gets worse or does not get better with medicine, especially pain when you urinate lasting longer than 72 hours after the procedure. ?Have trouble urinating. ?Get help right away if you: ?Have blood clots in your urine. ?Have a fever or chills. ?Are unable to urinate. ?Summary ?Cystoscopy is a procedure that is used to help diagnose and sometimes treat conditions that affect the lower urinary tract. ?Cystoscopy is done using a thin, tube-shaped instrument with a light and camera at the end. ?After the procedure, it is common to have some soreness or pain in your urethra. ?It is normal to have blood in your urine after the procedure.  ?If you were prescribed an antibiotic medicine, take it as told by your health care provider.  ?This information is not intended to replace advice given to you by your health care provider. Make sure you discuss any questions you have with your health care provider. ?Document Revised: 01/01/2018 Document Reviewed: 01/01/2018 ?Elsevier Patient Education ? [place] ? ?Prostate Biopsy Instructions ? ?Stop all aspirin or blood thinners (aspirin, plavix, coumadin, warfarin, motrin, ibuprofen, advil, aleve, naproxen, naprosyn) for 7 days prior to the procedure.  If you have any questions about stopping these medications, please contact your primary care physician or cardiologist. ? ?Having a light meal prior to the procedure is recommended.  If you are diabetic or have low blood sugar please bring a small snack or glucose  tablet. ? ?A Fleets enema is needed to be purchased over the counter at a local pharmacy and used 2 hours before you scheduled appointment.  This can be purchased over the counter at any pharmacy. ? ?Antibiotics will be administered in the clinic at the time of the procedure unless otherwise specified.   ? ?Please bring someone with you to the procedure to drive you home. ? ?A follow up appointment has been scheduled for you to receive the results of the biopsy. ? ?If you have any questions or concerns, please feel free to call the office at [phone] or send a Mychart message. ? ? ? ?Thank you, ?Staff at [facility]  ?

## 2021-05-24 DIAGNOSIS — D225 Melanocytic nevi of trunk: Secondary | ICD-10-CM | POA: Diagnosis not present

## 2021-05-24 DIAGNOSIS — D2262 Melanocytic nevi of left upper limb, including shoulder: Secondary | ICD-10-CM | POA: Diagnosis not present

## 2021-05-24 DIAGNOSIS — D2261 Melanocytic nevi of right upper limb, including shoulder: Secondary | ICD-10-CM | POA: Diagnosis not present

## 2021-05-24 DIAGNOSIS — D2271 Melanocytic nevi of right lower limb, including hip: Secondary | ICD-10-CM | POA: Diagnosis not present

## 2021-06-02 ENCOUNTER — Ambulatory Visit: Payer: Medicare Other | Admitting: Urology

## 2021-06-02 ENCOUNTER — Ambulatory Visit
Admission: RE | Admit: 2021-06-02 | Discharge: 2021-06-02 | Disposition: A | Discharge status: Home / Self Care | Payer: Medicare Other | Source: Ambulatory Visit | Attending: Urology | Admitting: Urology

## 2021-06-02 ENCOUNTER — Encounter: Payer: Self-pay | Admitting: Urology

## 2021-06-02 VITALS — BP 126/77 | HR 64 | Ht 72.0 in | Wt 238.0 lb

## 2021-06-02 DIAGNOSIS — K7689 Other specified diseases of liver: Secondary | ICD-10-CM | POA: Diagnosis not present

## 2021-06-02 DIAGNOSIS — R972 Elevated prostate specific antigen [PSA]: Secondary | ICD-10-CM | POA: Diagnosis not present

## 2021-06-02 DIAGNOSIS — N200 Calculus of kidney: Secondary | ICD-10-CM | POA: Diagnosis not present

## 2021-06-02 DIAGNOSIS — R319 Hematuria, unspecified: Secondary | ICD-10-CM

## 2021-06-02 DIAGNOSIS — N281 Cyst of kidney, acquired: Secondary | ICD-10-CM | POA: Diagnosis not present

## 2021-06-02 DIAGNOSIS — C61 Malignant neoplasm of prostate: Secondary | ICD-10-CM | POA: Diagnosis not present

## 2021-06-02 LAB — URINALYSIS, COMPLETE
Bilirubin, UA: NEGATIVE
Glucose, UA: NEGATIVE
Ketones, UA: NEGATIVE
Leukocytes,UA: NEGATIVE
Nitrite, UA: NEGATIVE
Protein,UA: NEGATIVE
RBC, UA: NEGATIVE
Specific Gravity, UA: 1.01 (ref 1.005–1.030)
Urobilinogen, Ur: 0.2 mg/dL (ref 0.2–1.0)
pH, UA: 6.5 (ref 5.0–7.5)

## 2021-06-02 LAB — MICROSCOPIC EXAMINATION: Bacteria, UA: NONE SEEN

## 2021-06-02 LAB — POCT I-STAT CREATININE: Creatinine, Ser: 1.4 mg/dL — ABNORMAL HIGH (ref 0.61–1.24)

## 2021-06-02 MED ORDER — IOHEXOL 300 MG/ML  SOLN
100.0000 mL | Freq: Once | INTRAMUSCULAR | Status: AC | PRN
  Administered 2021-06-02: 100 mL via INTRAVENOUS

## 2021-06-02 MED ORDER — GENTAMICIN SULFATE 40 MG/ML IJ SOLN
80.0000 mg | Freq: Once | INTRAMUSCULAR | Status: AC
  Administered 2021-06-02: 80 mg via INTRAMUSCULAR

## 2021-06-02 MED ORDER — LEVOFLOXACIN 500 MG PO TABS
500.0000 mg | ORAL_TABLET | Freq: Once | ORAL | Status: AC
  Administered 2021-06-02: 500 mg via ORAL

## 2021-06-02 NOTE — Progress Notes (Signed)
? ?  06/02/21 ? ?CC:  ?Chief Complaint  ?Patient presents with  ? Prostate Biopsy  ? ?HPI: ?Juan West is a 71 y.o. male with a personal history of peyronie's disease, hematuria and elevated PSA , who presents today for cysto/ prostate biopsy/ CT results. ? ?3.48.  This was up from the previous year on 02/09/2017 at which time his PSA was 2.56. His most recent PSA was 4.5 on 04/12/2021.  ? ?He underwent a CTU today, final report pending. ? ? ?Vitals:  ? 06/02/21 1133  ?BP: 126/77  ?Pulse: 64  ? ?NED. A&Ox3.   ?No respiratory distress   ?Abd soft, NT, ND ?Normal external genitalia with patent urethral meatus ? ? ?Cystoscopy Procedure Note ? ?Patient identification was confirmed, informed consent was obtained, and patient was prepped using Betadine solution.  Lidocaine jelly was administered per urethral meatus.   ? ? ?Pre-Procedure: ?- Inspection reveals a normal caliber ureteral meatus. ? ?Procedure: ?The flexible cystoscope was introduced without difficulty ?- No urethral strictures/lesions are present. ?- Enlarged prostate bilobar coaptation. Bleeding with manipulation  ?- Normal bladder neck ?- Bilateral ureteral orifices identified ?- Bladder mucosa  reveals no ulcers, tumors, or lesions ?- No bladder stones ?- No trabeculation ?-Glomerulations throughout bladder  ?Retroflexion shows slight intravesical protrusion that a discrete median lobe ? ? ?Post-Procedure: ?- Patient tolerated the procedure well ? ?Prostate Biopsy Procedure  ? ?Informed consent was obtained after discussing risks/benefits of the procedure.  A time out was performed to ensure correct patient identity. ? ?Pre-Procedure: ?- Last PSA Level ?Component ?    Latest Ref Rng 04/12/2021  ?Prostate Specific Ag, Serum ?    0.0 - 4.0 ng/mL 4.5 (H)   ?  ?(H) High ? ?- Gentamicin given prophylactically ?- Levaquin 500 mg administered PO ?-Transrectal Ultrasound performed revealing a 56.4 gm prostate ?-No significant hypoechoic or median lobe  noted ? ?Procedure: ?- Prostate block performed using 10 cc 1% lidocaine and biopsies taken from sextant areas, a total of 12 under ultrasound guidance. ? ?Post-Procedure: ?- Patient tolerated the procedure well ?- He was counseled to seek immediate medical attention if experiences any severe pain, significant bleeding, or fevers ?- Return in one week to discuss biopsy results ? ? ?I,Kailey Littlejohn,acting as a scribe for Hollice Espy, MD.,have documented all relevant documentation on the behalf of Hollice Espy, MD,as directed by  Hollice Espy, MD while in the presence of Hollice Espy, MD. ? ?I have reviewed the above documentation for accuracy and completeness, and I agree with the above.  ? ?Hollice Espy, MD ? ?

## 2021-06-03 ENCOUNTER — Encounter: Payer: Self-pay | Admitting: Urology

## 2021-06-03 LAB — SURGICAL PATHOLOGY

## 2021-06-08 ENCOUNTER — Encounter: Payer: Self-pay | Admitting: Urology

## 2021-06-08 ENCOUNTER — Ambulatory Visit: Payer: Medicare Other | Admitting: Urology

## 2021-06-08 VITALS — BP 156/88 | HR 92 | Ht 72.0 in | Wt 238.0 lb

## 2021-06-08 DIAGNOSIS — C61 Malignant neoplasm of prostate: Secondary | ICD-10-CM | POA: Diagnosis not present

## 2021-06-08 DIAGNOSIS — R972 Elevated prostate specific antigen [PSA]: Secondary | ICD-10-CM

## 2021-06-08 NOTE — Progress Notes (Signed)
06/08/21 9:29 AM   Juan West [birth year] [id number]  Referring provider:  Jon Billings, NP [address] [place],  [place] [address] Chief Complaint  Patient presents with   Results    HPI: Juan West is a 71 y.o.male with a personal history of peyronie's disease, hematuria and elevated PSA , who presents today for prostate biopsy results.   His PSA had elevated to 3.48 which was up from the previous year on 02/09/2017 at which time his PSA was 2.56. His most recent PSA was 4.5 on 04/12/2021.   He underwent a CTU on 06/02/2021 that visualized Bosniak class 1 cyst arises off the upper pole of the right kidney measuring 1 point 8 cm. Bosniak class 1 cyst arises off the medial cortex of the interpolar left kidney measuring 1.5 cm. Small stone within the inferior pole of the left kidney measures 2-3 mm.   He underwent a cysto at time of prostate biopsy that revealed slight intravesical protrusion with a discrete median lobe.   He is s/p prostate biopsy on 06/02/2021. TRUS 56.4. Surgical pathology revealed Gleason 3+3 involving 2/12 cores affecting up to 20% in the left lateral base.   IPSS 3; below. SHIM 12.   Surgical pathology  [A] PROSTATE, LEFT BASE:   NEGATIVE FOR MALIGNANCY.   [B] PROSTATE, LEFT MID:   NEGATIVE FOR MALIGNANCY.   [C] PROSTATE, LEFT APEX:   NEGATIVE FOR MALIGNANCY.   [D] PROSTATE, RIGHT BASE:   NEGATIVE FOR MALIGNANCY.   [E] PROSTATE, RIGHT MID:   NEGATIVE FOR MALIGNANCY.   [F] PROSTATE, RIGHT APEX:   NEGATIVE FOR MALIGNANCY.   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   [I] PROSTATE, LEFT LATERAL APEX:   NEGATIVE FOR MALIGNANCY.   [J] PROSTATE, RIGHT LATERAL BASE:   NEGATIVE FOR MALIGNANCY.   [K] PROSTATE, RIGHT LATERAL MID:    NEGATIVE FOR MALIGNANCY.   [L] PROSTATE, RIGHT LATERAL APEX:   NEGATIVE FOR MALIGNANCY.      IPSS     Row Name 06/08/21 1400         International Prostate Symptom Score   How often have you had the sensation of not emptying your bladder? Not at All     How often have you had to urinate less than every two hours? Less than 1 in 5 times     How often have you found you stopped and started again several times when you urinated? Not at All     How often have you found it difficult to postpone urination? Not at All     How often have you had a weak urinary stream? Not at All     How often have you had to strain to start urination? Not at All     How many times did you typically get up at night to urinate? 2 Times     Total IPSS Score 3       Quality of Life due to urinary symptoms   If you were to spend the rest of your life with your urinary condition just the way it is now how would you feel about that? Pleased  Score:  1-7 Mild 8-19 Moderate 20-35 Severe   SHIM     Row Name 06/08/21 1427         SHIM: Over the last 6 months:   How do you rate your confidence that you could get and keep an erection? High     When you had erections with sexual stimulation, how often were your erections hard enough for penetration (entering your partner)? Almost Always or Always     During sexual intercourse, how often were you able to maintain your erection after you had penetrated (entered) your partner? Almost Never or Never     During sexual intercourse, how difficult was it to maintain your erection to completion of intercourse? Extremely Difficult     When you attempted sexual intercourse, how often was it satisfactory for you? Almost Never or Never       SHIM Total Score   SHIM 12              PMH: Past Medical History:  Diagnosis Date   Allergy    Cancer ([place])    GERD (gastroesophageal reflux disease)     Surgical History: Past Surgical History:   Procedure Laterality Date   BASAL CELL CARCINOMA EXCISION     HERNIA REPAIR     NO PAST SURGERIES     PTERYGIUM EXCISION     SQUAMOUS CELL CARCINOMA EXCISION      Home Medications:  Allergies as of 06/08/2021       Reactions   Codeine Other (See Comments)   hallucinations   Melatonin Other (See Comments)   Drowsiness   Trazodone And Nefazodone Nausea And Vomiting        Medication List        Accurate as of Jun 08, 2021 11:59 PM. If you have any questions, ask your nurse or doctor.          cetirizine 10 MG tablet Commonly known as: ZYRTEC Take 1 tablet (10 mg total) by mouth daily.   lisinopril-hydrochlorothiazide 10-12.5 MG tablet Commonly known as: ZESTORETIC Take 1 tablet by mouth daily.   omeprazole 10 MG capsule Commonly known as: PRILOSEC Take 1 capsule (10 mg total) by mouth daily.        Allergies:  Allergies  Allergen Reactions   Codeine Other (See Comments)    hallucinations   Melatonin Other (See Comments)    Drowsiness    Trazodone And Nefazodone Nausea And Vomiting    Family History: Family History  Problem Relation Age of Onset   Stroke Mother    Cancer Father    Stroke Brother    Autism Granddaughter     Social History:  reports that he has never smoked. He has never used smokeless tobacco. He reports current alcohol use. He reports that he does not use drugs.   Physical Exam: BP (!) 156/88   Pulse 92   Ht 6' (1.829 m)   Wt 238 lb (108 kg)   BMI 32.28 kg/m   Constitutional:  Alert and oriented, No acute distress. HEENT: [place] AT, moist mucus membranes.  Trachea midline, no masses. Cardiovascular: No clubbing, cyanosis, or edema. Respiratory: Normal respiratory effort, no increased work of breathing. Skin: No rashes, bruises or suspicious lesions. Neurologic: Grossly intact, no focal deficits, moving all 4 extremities. Psychiatric: Normal mood and affect.  Laboratory Data: Lab Results  Component Value Date    CREATININE 1.40 (H) 06/02/2021    Assessment & Plan:    Prostate cancer  -  Newly dx very low risk  - Strongly recommend active surveillance discussed various surveillance options. Will proceed with PSA 6q. He is agreeable with his plan.  -Discussed various surveillance protocols -May further survey with prostate MRI vs repeat biopsy in the future.     F/u PSA in 6 months/ PSA/ DRE / with MD in 1year   I,Terilynn Buresh,acting as a scribe for Hollice Espy, MD.,have documented all relevant documentation on the behalf of Hollice Espy, MD,as directed by  Hollice Espy, MD while in the presence of Hollice Espy, MD.  I have reviewed the above documentation for accuracy and completeness, and I agree with the above.   Hollice Espy, MD  [facility] [address] [place] [employer], [place] [address] [phone]

## 2021-07-14 ENCOUNTER — Ambulatory Visit: Payer: Medicare Other | Admitting: Nurse Practitioner

## 2021-07-19 ENCOUNTER — Encounter: Payer: Self-pay | Admitting: Urology

## 2021-07-19 DIAGNOSIS — H00014 Hordeolum externum left upper eyelid: Secondary | ICD-10-CM | POA: Diagnosis not present

## 2021-07-20 NOTE — Telephone Encounter (Signed)
Called patient to discuss symptoms, patient does not see any more blood in his urine. Symptoms have improved. Patient informed to notify the office with any worsening symptoms.

## 2021-08-01 ENCOUNTER — Encounter: Payer: Self-pay | Admitting: Nurse Practitioner

## 2021-08-01 ENCOUNTER — Ambulatory Visit (INDEPENDENT_AMBULATORY_CARE_PROVIDER_SITE_OTHER): Payer: Medicare Other | Admitting: Nurse Practitioner

## 2021-08-01 VITALS — BP 115/77 | HR 82 | Temp 98.7°F | Wt 240.6 lb

## 2021-08-01 DIAGNOSIS — R7989 Other specified abnormal findings of blood chemistry: Secondary | ICD-10-CM | POA: Diagnosis not present

## 2021-08-01 DIAGNOSIS — I10 Essential (primary) hypertension: Secondary | ICD-10-CM

## 2021-08-01 MED ORDER — OMEPRAZOLE 10 MG PO CPDR: 10.0000 mg | DELAYED_RELEASE_CAPSULE | Freq: Every day | ORAL | 1 refills | Status: DC

## 2021-08-01 MED ORDER — LISINOPRIL-HYDROCHLOROTHIAZIDE 10-12.5 MG PO TABS: 1.0000 | ORAL_TABLET | Freq: Every day | ORAL | 1 refills | Status: DC

## 2021-08-01 NOTE — Assessment & Plan Note (Signed)
Chronic.  Controlled.  Continue with current medication regimen Lisinopril 10 and HCTZ 12.'5mg'$ .  Refills sent today.  Labs ordered today.  Return to clinic in 6 months for reevaluation.  Call sooner if concerns arise.

## 2021-08-01 NOTE — Assessment & Plan Note (Signed)
Chronic. Elevated in March. Will repeat labs at visit today. Will make recommendations based on lab results.

## 2021-08-01 NOTE — Progress Notes (Signed)
Established Patient Office Visit  Subjective:  Patient ID: Juan West, male    DOB: [birth year]  Age: 71 y.o. MRN: [id number]  CC:  Chief Complaint  Patient presents with   Hypertension    HPI Norrin Shreffler presents for follow up on hypertension.  HYPERTENSION Hypertension status: controlled  Satisfied with current treatment? yes Duration of hypertension: months BP medication side effects:  no Medication compliance: excellent compliance Previous BP meds:lisinopril, hctz Aspirin: no Recurrent headaches: no Visual changes: no Palpitations: no Dyspnea: yes - with extreme exertion Chest pain: no Lower extremity edema: no Dizzy/lightheaded: yes - intermittent   Past Medical History:  Diagnosis Date   Allergy    Cancer ([place])    GERD (gastroesophageal reflux disease)     Past Surgical History:  Procedure Laterality Date   BASAL CELL CARCINOMA EXCISION     HERNIA REPAIR     NO PAST SURGERIES     PTERYGIUM EXCISION     SQUAMOUS CELL CARCINOMA EXCISION      Family History  Problem Relation Age of Onset   Stroke Mother    Cancer Father    Stroke Brother    Autism Granddaughter     Social History   Socioeconomic History   Marital status: Divorced    Spouse name: Not on file   Number of children: Not on file   Years of education: Not on file   Highest education level: Not on file  Occupational History   Not on file  Tobacco Use   Smoking status: Never   Smokeless tobacco: Never  Vaping Use   Vaping Use: Never used  Substance and Sexual Activity   Alcohol use: Yes    Comment: on occasion   Drug use: Never   Sexual activity: Not Currently  Other Topics Concern   Not on file  Social History Narrative   Not on file   Social Determinants of Health   Financial Resource Strain: Not on file  Food Insecurity: Not on file  Transportation Needs: Not on file  Physical Activity: Not on file  Stress: Not on file  Social Connections: Not on file   Intimate Partner Violence: Not on file    Outpatient Medications Prior to Visit  Medication Sig Dispense Refill   cetirizine (ZYRTEC) 10 MG tablet Take 1 tablet (10 mg total) by mouth daily. 90 tablet 1   lisinopril-hydrochlorothiazide (ZESTORETIC) 10-12.5 MG tablet Take 1 tablet by mouth daily. 90 tablet 1   omeprazole (PRILOSEC) 10 MG capsule Take 1 capsule (10 mg total) by mouth daily. 90 capsule 1   No facility-administered medications prior to visit.    Allergies  Allergen Reactions   Codeine Other (See Comments)    hallucinations   Melatonin Other (See Comments)    Drowsiness    Trazodone And Nefazodone Nausea And Vomiting    ROS Review of Systems  Eyes:  Negative for visual disturbance.  Respiratory:  Negative for chest tightness and shortness of breath.   Cardiovascular:  Negative for chest pain, palpitations and leg swelling.  Neurological:  Negative for dizziness, light-headedness and headaches.      Objective:    Physical Exam Vitals and nursing note reviewed.  Constitutional:      Appearance: Normal appearance.  HENT:     Head: Normocephalic.  Cardiovascular:     Rate and Rhythm: Normal rate and regular rhythm.     Pulses: Normal pulses.     Heart sounds: Normal heart sounds.  Pulmonary:     Effort: Pulmonary effort is normal.     Breath sounds: Normal breath sounds.  Musculoskeletal:     Cervical back: Normal range of motion.  Skin:    General: Skin is warm and dry.  Neurological:     General: No focal deficit present.     Mental Status: He is alert and oriented to person, place, and time.  Psychiatric:        Mood and Affect: Mood normal.        Behavior: Behavior normal.        Thought Content: Thought content normal.        Judgment: Judgment normal.     BP 115/77   Pulse 82   Temp 98.7 F (37.1 C) (Oral)   Wt 240 lb 9.6 oz (109.1 kg)   SpO2 93%   BMI 32.63 kg/m  Wt Readings from Last 3 Encounters:  08/01/21 240 lb 9.6 oz (109.1  kg)  06/08/21 238 lb (108 kg)  06/02/21 238 lb (108 kg)     Health Maintenance Due  Topic Date Due   Zoster Vaccines- Shingrix (1 of 2) Never done   COVID-19 Vaccine (3 - Moderna risk series) 04/09/2019     There are no preventive care reminders to display for this patient.  Lab Results  Component Value Date   TSH 1.870 08/10/2020   Lab Results  Component Value Date   WBC 7.9 04/12/2021   HGB 18.6 (H) 04/12/2021   HCT 53.0 (H) 04/12/2021   MCV 87 04/12/2021   PLT 322 04/12/2021   Lab Results  Component Value Date   NA 137 04/12/2021   K 4.2 04/12/2021   CO2 21 04/12/2021   GLUCOSE 116 (H) 04/12/2021   BUN 25 04/12/2021   CREATININE 1.40 (H) 06/02/2021   BILITOT 0.4 04/12/2021   ALKPHOS 81 04/12/2021   AST 25 04/12/2021   ALT 35 04/12/2021   PROT 7.3 04/12/2021   ALBUMIN 4.9 (H) 04/12/2021   CALCIUM 9.9 04/12/2021   ANIONGAP 9 11/11/2020   EGFR 53 (L) 04/12/2021   Lab Results  Component Value Date   CHOL 144 05/19/2020   Lab Results  Component Value Date   HDL 32 (A) 05/19/2020   Lab Results  Component Value Date   LDLCALC 93 05/19/2020   Lab Results  Component Value Date   TRIG 102 05/19/2020   No results found for: "CHOLHDL" No results found for: "HGBA1C"    Assessment & Plan:   Problem List Items Addressed This Visit       Cardiovascular and Mediastinum   Primary hypertension - Primary    Chronic.  Controlled.  Continue with current medication regimen Lisinopril 10 and HCTZ 12.67m.  Refills sent today.  Labs ordered today.  Return to clinic in 6 months for reevaluation.  Call sooner if concerns arise.        Relevant Medications   lisinopril-hydrochlorothiazide (ZESTORETIC) 10-12.5 MG tablet   Other Relevant Orders   Comp Met (CMET)     Other   Elevated serum creatinine    Chronic. Elevated in March. Will repeat labs at visit today. Will make recommendations based on lab results.       Relevant Orders   Comp Met (CMET)     Meds ordered this encounter  Medications   lisinopril-hydrochlorothiazide (ZESTORETIC) 10-12.5 MG tablet    Sig: Take 1 tablet by mouth daily.    Dispense:  90 tablet    Refill:  1    Order Specific Question:   Supervising Provider    Answer:   Valerie Roys [[id number]]   omeprazole (PRILOSEC) 10 MG capsule    Sig: Take 1 capsule (10 mg total) by mouth daily.    Dispense:  90 capsule    Refill:  1    Order Specific Question:   Supervising Provider    Answer:   Valerie Roys [[id number]]    Follow-up: Return in about 6 months (around 02/01/2022) for Physical and Fasting labs.    Jon Billings, NP

## 2021-08-02 LAB — COMPREHENSIVE METABOLIC PANEL
ALT: 26 IU/L (ref 0–44)
AST: 29 IU/L (ref 0–40)
Albumin/Globulin Ratio: 1.9 (ref 1.2–2.2)
Albumin: 4.7 g/dL (ref 3.8–4.8)
Alkaline Phosphatase: 69 IU/L (ref 44–121)
BUN/Creatinine Ratio: 18 (ref 10–24)
BUN: 21 mg/dL (ref 8–27)
Bilirubin Total: 0.6 mg/dL (ref 0.0–1.2)
CO2: 20 mmol/L (ref 20–29)
Calcium: 9.6 mg/dL (ref 8.6–10.2)
Chloride: 99 mmol/L (ref 96–106)
Creatinine, Ser: 1.19 mg/dL (ref 0.76–1.27)
Globulin, Total: 2.5 g/dL (ref 1.5–4.5)
Glucose: 98 mg/dL (ref 70–99)
Potassium: 4.2 mmol/L (ref 3.5–5.2)
Sodium: 137 mmol/L (ref 134–144)
Total Protein: 7.2 g/dL (ref 6.0–8.5)
eGFR: 65 mL/min/{1.73_m2} (ref >59)

## 2021-08-02 NOTE — Progress Notes (Signed)
Good Morning. It was nice to see you yesterday.  Your kidney function returned to normal which is great news.  We will continue to monitor it in the future.  See you at our next visit.

## 2021-08-30 DIAGNOSIS — L578 Other skin changes due to chronic exposure to nonionizing radiation: Secondary | ICD-10-CM | POA: Diagnosis not present

## 2021-08-30 DIAGNOSIS — Z85828 Personal history of other malignant neoplasm of skin: Secondary | ICD-10-CM | POA: Diagnosis not present

## 2021-08-30 DIAGNOSIS — L820 Inflamed seborrheic keratosis: Secondary | ICD-10-CM | POA: Diagnosis not present

## 2021-08-30 DIAGNOSIS — D692 Other nonthrombocytopenic purpura: Secondary | ICD-10-CM | POA: Diagnosis not present

## 2021-08-30 DIAGNOSIS — Z08 Encounter for follow-up examination after completed treatment for malignant neoplasm: Secondary | ICD-10-CM | POA: Diagnosis not present

## 2021-10-17 ENCOUNTER — Encounter: Payer: Self-pay | Admitting: Nurse Practitioner

## 2021-12-09 ENCOUNTER — Other Ambulatory Visit: Payer: Medicare Other

## 2021-12-09 DIAGNOSIS — R972 Elevated prostate specific antigen [PSA]: Secondary | ICD-10-CM

## 2021-12-10 LAB — PSA: Prostate Specific Ag, Serum: 3.8 ng/mL (ref 0.0–4.0)

## 2022-02-01 NOTE — Progress Notes (Unsigned)
There were no vitals taken for this visit.   Subjective:    Patient ID: Juan West, male    DOB: Apr 14, 1950, 72 y.o.   MRN: [id number]  HPI: Juan West is a 72 y.o. male presenting on 02/02/2022 for comprehensive medical examination. Current medical complaints include:{Blank single:19197::"none","***"}  He currently lives with: Interim Problems from his last visit: {Blank single:19197::"yes","no"}  HYPERTENSION {Blank single:19197::"without","with"} Chronic Kidney Disease Hypertension status: {Blank single:19197::"controlled","uncontrolled","better","worse","exacerbated","stable"}  Satisfied with current treatment? {Blank single:19197::"yes","no"} Duration of hypertension: {Blank single:19197::"chronic","months","years"} BP monitoring frequency:  {Blank single:19197::"not checking","rarely","daily","weekly","monthly","a few times a day","a few times a week","a few times a month"} BP range:  BP medication side effects:  {Blank single:19197::"yes","no"} Medication compliance: {Blank single:19197::"excellent compliance","good compliance","fair compliance","poor compliance"} Previous BP meds:{Blank RSWNIOEV:03500::"XFGH","WEXHBZJIRC","VELFYBOFBP/ZWCHENIDPO","EUMPNTIR","WERXVQMGQQ","PYPPJKDTOI/ZTIW","PYKDXIPJAS (bystolic)","carvedilol","chlorthalidone","clonidine","diltiazem","exforge HCT","HCTZ","irbesartan (avapro)","labetalol","lisinopril","lisinopril-HCTZ","losartan (cozaar)","methyldopa","nifedipine","olmesartan (benicar)","olmesartan-HCTZ","quinapril","ramipril","spironalactone","tekturna","valsartan","valsartan-HCTZ","verapamil"} Aspirin: {Blank single:19197::"yes","no"} Recurrent headaches: {Blank single:19197::"yes","no"} Visual changes: {Blank single:19197::"yes","no"} Palpitations: {Blank single:19197::"yes","no"} Dyspnea: {Blank single:19197::"yes","no"} Chest pain: {Blank single:19197::"yes","no"} Lower extremity edema: {Blank  single:19197::"yes","no"} Dizzy/lightheaded: {Blank single:19197::"yes","no"}   Functional Status Survey:    FALL RISK:    08/01/2021    3:01 PM 11/25/2020    9:24 AM 09/13/2020    8:12 AM 07/08/2020    8:35 AM  [place] in the past year? 0 0 0 0  Number falls in past yr: 0 0 0 0  Injury with Fall? 0 0 0 0  Risk for fall due to : No Fall Risks  No Fall Risks No Fall Risks  Follow up Falls evaluation completed  Falls evaluation completed Falls evaluation completed    Depression Screen    07/08/2020    8:35 AM  Depression screen PHQ 2/9  Decreased Interest 2  Down, Depressed, Hopeless 2  PHQ - 2 Score 4  Altered sleeping 3  Tired, decreased energy 3  Change in appetite 0  Feeling bad or failure about yourself  0  Trouble concentrating 1  Moving slowly or fidgety/restless 0  Suicidal thoughts 0  PHQ-9 Score 11  Difficult doing work/chores Somewhat difficult    Advanced Directives <no information>  Past Medical History:  Past Medical History:  Diagnosis Date   Allergy    Cancer ([place])    GERD (gastroesophageal reflux disease)     Surgical History:  Past Surgical History:  Procedure Laterality Date   BASAL CELL CARCINOMA EXCISION     HERNIA REPAIR     NO PAST SURGERIES     PTERYGIUM EXCISION     SQUAMOUS CELL CARCINOMA EXCISION      Medications:  Current Outpatient Medications on File Prior to Visit  Medication Sig   cetirizine (ZYRTEC) 10 MG tablet Take 1 tablet (10 mg total) by mouth daily.   lisinopril-hydrochlorothiazide (ZESTORETIC) 10-12.5 MG tablet Take 1 tablet by mouth daily.   omeprazole (PRILOSEC) 10 MG capsule Take 1 capsule (10 mg total) by mouth daily.   No current facility-administered medications on file prior to visit.    Allergies:  Allergies  Allergen Reactions   Codeine Other (See Comments)    hallucinations   Melatonin Other (See Comments)    Drowsiness    Trazodone And Nefazodone Nausea And Vomiting    Social  History:  Social History   Socioeconomic History   Marital status: Divorced    Spouse name: Not on file   Number of children: Not on file   Years of education: Not on file   Highest education level: Not on file  Occupational History   Not on file  Tobacco Use  Smoking status: Never   Smokeless tobacco: Never  Vaping Use   Vaping Use: Never used  Substance and Sexual Activity   Alcohol use: Yes    Comment: on occasion   Drug use: Never   Sexual activity: Not Currently  Other Topics Concern   Not on file  Social History Narrative   Not on file   Social Determinants of Health   Financial Resource Strain: Not on file  Food Insecurity: Not on file  Transportation Needs: Not on file  Physical Activity: Not on file  Stress: Not on file  Social Connections: Not on file  Intimate Partner Violence: Not on file   Social History   Tobacco Use  Smoking Status Never  Smokeless Tobacco Never   Social History   Substance and Sexual Activity  Alcohol Use Yes   Comment: on occasion    Family History:  Family History  Problem Relation Age of Onset   Stroke Mother    Cancer Father    Stroke Brother    Autism Granddaughter     Past medical history, surgical history, medications, allergies, family history and social history reviewed with patient today and changes made to appropriate areas of the chart.   ROS All other ROS negative except what is listed above and in the HPI.      Objective:    There were no vitals taken for this visit.  Wt Readings from Last 3 Encounters:  08/01/21 240 lb 9.6 oz (109.1 kg)  06/08/21 238 lb (108 kg)  06/02/21 238 lb (108 kg)    No results found.  Physical Exam      No data to display          Cognitive Testing - 6-CIT  Correct? Score   What year is it? {YES NO:22349} {Numbers; 0-4:31231} Yes = 0    No = 4  What month is it? {YES NO:22349} {Numbers; 0-4:31231} Yes = 0    No = 3  Remember:     Pia [address]au, [place], [place]     What time is it? {YES NO:22349} {Numbers; 0-4:31231} Yes = 0    No = 3  Count backwards from 20 to 1 {YES NO:22349} {Numbers; 0-4:31231} Correct = 0    1 error = 2   More than 1 error = 4  Say the months of the year in reverse. {YES NO:22349} {Numbers; 0-4:31231} Correct = 0    1 error = 2   More than 1 error = 4  What address did I ask you to remember? {YES NO:22349} {NUMBERS; 0-10:5044} Correct = 0  1 error = 2    2 error = 4    3 error = 6    4 error = 8    All wrong = 10       TOTAL SCORE  {Numbers; 5-53:74827}/07   Interpretation:  {Desc; normal/abnormal:11317::"Normal"}  Normal (0-7) Abnormal (8-28)    Results for orders placed or performed in visit on 12/09/21  PSA  Result Value Ref Range   Prostate Specific Ag, Serum 3.8 0.0 - 4.0 ng/mL      Assessment & Plan:   Problem List Items Addressed This Visit       Cardiovascular and Mediastinum   Primary hypertension - Primary     Preventative Services:  Health Risk Assessment and Personalized Prevention Plan: Bone Mass Measurements: CVD Screening:  Colon Cancer Screening:  Depression Screening:  Diabetes Screening:  Glaucoma Screening:  Hepatitis B vaccine: Hepatitis C screening:  HIV Screening: Flu Vaccine: Lung cancer Screening: Obesity Screening:  Pneumonia Vaccines (2): STI Screening: PSA screening:  Discussed aspirin prophylaxis for myocardial infarction prevention and decision was {Blank single:19197::"it was not indicated","made to continue ASA","made to start ASA","made to stop ASA","that we recommended ASA, and patient refused"}  LABORATORY TESTING:  Health maintenance labs ordered today as discussed above.   The natural history of prostate cancer and ongoing controversy regarding screening and potential treatment outcomes of prostate cancer has been discussed with the patient. The meaning of a false positive PSA and a false negative PSA has been discussed. He indicates understanding of the  limitations of this screening test and wishes *** to proceed with screening PSA testing.   IMMUNIZATIONS:   - Tdap: Tetanus vaccination status reviewed: {tetanus status:315746}. - Influenza: {Blank single:19197::"Up to date","Administered today","Postponed to flu season","Refused","Given elsewhere"} - Pneumovax: {Blank single:19197::"Up to date","Administered today","Not applicable","Refused","Given elsewhere"} - Prevnar: {Blank single:19197::"Up to date","Administered today","Not applicable","Refused","Given elsewhere"} - Zostavax vaccine: {Blank single:19197::"Up to date","Administered today","Not applicable","Refused","Given elsewhere"}  SCREENING: - Colonoscopy: {Blank single:19197::"Up to date","Ordered today","Not applicable","Refused","Done elsewhere"}  Discussed with patient purpose of the colonoscopy is to detect colon cancer at curable precancerous or early stages   - AAA Screening: {Blank single:19197::"Up to date","Ordered today","Not applicable","Refused","Done elsewhere"}  -Hearing Test: {Blank single:19197::"Up to date","Ordered today","Not applicable","Refused","Done elsewhere"}  -Spirometry: {Blank single:19197::"Up to date","Ordered today","Not applicable","Refused","Done elsewhere"}   PATIENT COUNSELING:    Sexuality: Discussed sexually transmitted diseases, partner selection, use of condoms, avoidance of unintended pregnancy  and contraceptive alternatives.   Advised to avoid cigarette smoking.  I discussed with the patient that most people either abstain from alcohol or drink within safe limits (<=14/week and <=4 drinks/occasion for males, <=7/weeks and <= 3 drinks/occasion for females) and that the risk for alcohol disorders and other health effects rises proportionally with the number of drinks per week and how often a drinker exceeds daily limits.  Discussed cessation/primary prevention of drug use and availability of treatment for abuse.   Diet: Encouraged to  adjust caloric intake to maintain  or achieve ideal body weight, to reduce intake of dietary saturated fat and total fat, to limit sodium intake by avoiding high sodium foods and not adding table salt, and to maintain adequate dietary potassium and calcium preferably from fresh fruits, vegetables, and low-fat dairy products.    stressed the importance of regular exercise  Injury prevention: Discussed safety belts, safety helmets, smoke detector, smoking near bedding or upholstery.   Dental health: Discussed importance of regular tooth brushing, flossing, and dental visits.   Follow up plan: NEXT PREVENTATIVE PHYSICAL DUE IN 1 YEAR. No follow-ups on file.

## 2022-02-02 ENCOUNTER — Other Ambulatory Visit: Payer: Self-pay

## 2022-02-02 ENCOUNTER — Encounter: Payer: Self-pay | Admitting: Nurse Practitioner

## 2022-02-02 ENCOUNTER — Telehealth: Payer: Self-pay

## 2022-02-02 ENCOUNTER — Ambulatory Visit (INDEPENDENT_AMBULATORY_CARE_PROVIDER_SITE_OTHER): Payer: Medicare Other | Admitting: Nurse Practitioner

## 2022-02-02 VITALS — BP 128/68 | HR 69 | Temp 98.2°F | Ht 72.0 in | Wt 236.0 lb

## 2022-02-02 DIAGNOSIS — Z0000 Encounter for general adult medical examination without abnormal findings: Secondary | ICD-10-CM | POA: Diagnosis not present

## 2022-02-02 DIAGNOSIS — Z136 Encounter for screening for cardiovascular disorders: Secondary | ICD-10-CM | POA: Diagnosis not present

## 2022-02-02 DIAGNOSIS — Z1159 Encounter for screening for other viral diseases: Secondary | ICD-10-CM

## 2022-02-02 DIAGNOSIS — Z1211 Encounter for screening for malignant neoplasm of colon: Secondary | ICD-10-CM

## 2022-02-02 DIAGNOSIS — I10 Essential (primary) hypertension: Secondary | ICD-10-CM

## 2022-02-02 DIAGNOSIS — Z7189 Other specified counseling: Secondary | ICD-10-CM

## 2022-02-02 LAB — URINALYSIS, ROUTINE W REFLEX MICROSCOPIC
Bilirubin, UA: NEGATIVE
Glucose, UA: NEGATIVE
Ketones, UA: NEGATIVE
Leukocytes,UA: NEGATIVE
Nitrite, UA: NEGATIVE
Protein,UA: NEGATIVE
RBC, UA: NEGATIVE
Specific Gravity, UA: 1.015 (ref 1.005–1.030)
Urobilinogen, Ur: 1 mg/dL (ref 0.2–1.0)
pH, UA: 7 (ref 5.0–7.5)

## 2022-02-02 MED ORDER — LISINOPRIL-HYDROCHLOROTHIAZIDE 10-12.5 MG PO TABS: 1.0000 | ORAL_TABLET | Freq: Every day | ORAL | 1 refills | Status: DC

## 2022-02-02 MED ORDER — OMEPRAZOLE 10 MG PO CPDR: 10.0000 mg | DELAYED_RELEASE_CAPSULE | Freq: Every day | ORAL | 1 refills | Status: DC

## 2022-02-02 MED ORDER — NA SULFATE-K SULFATE-MG SULF 17.5-3.13-1.6 GM/177ML PO SOLN: 1.0000 | Freq: Once | ORAL | 0 refills | Status: AC

## 2022-02-02 NOTE — Assessment & Plan Note (Signed)
Chronic.  Controlled.  Continue with current medication regimen of Lisinopril with HCTZ.  Refills sent today.  Labs ordered today.  Return to clinic in 6 months for reevaluation.  Call sooner if concerns arise.

## 2022-02-02 NOTE — Assessment & Plan Note (Signed)
A voluntary discussion about advance care planning including the explanation and discussion of advance directives was extensively discussed  with the patient for 10 minutes with patient.  Explanation about the health care proxy and Living will was reviewed and packet with forms with explanation of how to fill them out was given.  During this discussion, the patient was able to identify a health care proxy as his sister and plans to fill out the paperwork required.  Patient was offered a separate [place] visit for further assistance with forms.

## 2022-02-02 NOTE — Telephone Encounter (Signed)
Gastroenterology Pre-Procedure Review  Request Date: 01/22 Requesting Physician: Dr. Marius Ditch  PATIENT REVIEW QUESTIONS: The patient responded to the following health history questions as indicated:    1. Are you having any GI issues? no 2. Do you have a personal history of Polyps? no 3. Do you have a family history of Colon Cancer or Polyps? no 4. Diabetes Mellitus? no 5. Joint replacements in the past 12 months?no 6. Major health problems in the past 3 months?no 7. Any artificial heart valves, MVP, or defibrillator?no    MEDICATIONS & ALLERGIES:    Patient reports the following regarding taking any anticoagulation/antiplatelet therapy:   Plavix, Coumadin, Eliquis, Xarelto, Lovenox, Pradaxa, Brilinta, or Effient? no Aspirin? no  Patient confirms/reports the following medications:  Current Outpatient Medications  Medication Sig Dispense Refill   cetirizine (ZYRTEC) 10 MG tablet Take 1 tablet (10 mg total) by mouth daily. 90 tablet 1   lisinopril-hydrochlorothiazide (ZESTORETIC) 10-12.5 MG tablet Take 1 tablet by mouth daily. 90 tablet 1   omeprazole (PRILOSEC) 10 MG capsule Take 1 capsule (10 mg total) by mouth daily. 90 capsule 1   No current facility-administered medications for this visit.    Patient confirms/reports the following allergies:  Allergies  Allergen Reactions   Codeine Other (See Comments)    hallucinations   Melatonin Other (See Comments)    Drowsiness    Trazodone And Nefazodone Nausea And Vomiting    No orders of the defined types were placed in this encounter.   AUTHORIZATION INFORMATION Primary Insurance: 1D#: Group #:  Secondary Insurance: 1D#: Group #:  SCHEDULE INFORMATION: Date: 02/13/22 Time: Location: [facility]

## 2022-02-02 NOTE — Patient Instructions (Signed)
Preventative Services:  Health Risk Assessment and Personalized Prevention Plan: Up to date Bone Mass Measurements: NA CVD Screening: Up to date Colon Cancer Screening: Ordered today Depression Screening: up to date Diabetes Screening: Up to date Glaucoma Screening: Up to date Hepatitis B vaccine: NA Hepatitis C screening: Up to date HIV Screening: Up to date Flu Vaccine: Up to date Lung cancer Screening: NA Obesity Screening: Up to date Pneumonia Vaccines (2): Up to date STI Screening: NA PSA screening: Up to date

## 2022-02-03 ENCOUNTER — Encounter: Payer: Self-pay | Admitting: Nurse Practitioner

## 2022-02-03 LAB — COMPREHENSIVE METABOLIC PANEL
ALT: 20 IU/L (ref 0–44)
AST: 21 IU/L (ref 0–40)
Albumin/Globulin Ratio: 1.9 (ref 1.2–2.2)
Albumin: 4.3 g/dL (ref 3.8–4.8)
Alkaline Phosphatase: 71 IU/L (ref 44–121)
BUN/Creatinine Ratio: 15 (ref 10–24)
BUN: 18 mg/dL (ref 8–27)
Bilirubin Total: 0.7 mg/dL (ref 0.0–1.2)
CO2: 23 mmol/L (ref 20–29)
Calcium: 9.4 mg/dL (ref 8.6–10.2)
Chloride: 102 mmol/L (ref 96–106)
Creatinine, Ser: 1.21 mg/dL (ref 0.76–1.27)
Globulin, Total: 2.3 g/dL (ref 1.5–4.5)
Glucose: 98 mg/dL (ref 70–99)
Potassium: 4.1 mmol/L (ref 3.5–5.2)
Sodium: 139 mmol/L (ref 134–144)
Total Protein: 6.6 g/dL (ref 6.0–8.5)
eGFR: 64 mL/min/{1.73_m2} (ref >59)

## 2022-02-03 LAB — LIPID PANEL
Chol/HDL Ratio: 3.5 ratio (ref 0.0–5.0)
Cholesterol, Total: 129 mg/dL (ref 100–199)
HDL: 37 mg/dL — ABNORMAL LOW (ref >39)
LDL Chol Calc (NIH): 77 mg/dL (ref 0–99)
Triglycerides: 73 mg/dL (ref 0–149)
VLDL Cholesterol Cal: 15 mg/dL (ref 5–40)

## 2022-02-03 LAB — CBC WITH DIFFERENTIAL/PLATELET
Basophils Absolute: 0.1 10*3/uL (ref 0.0–0.2)
Basos: 2 %
EOS (ABSOLUTE): 0.3 10*3/uL (ref 0.0–0.4)
Eos: 6 %
Hematocrit: 48.1 % (ref 37.5–51.0)
Hemoglobin: 16.5 g/dL (ref 13.0–17.7)
Immature Grans (Abs): 0 10*3/uL (ref 0.0–0.1)
Immature Granulocytes: 0 %
Lymphocytes Absolute: 1.1 10*3/uL (ref 0.7–3.1)
Lymphs: 24 %
MCH: 30.7 pg (ref 26.6–33.0)
MCHC: 34.3 g/dL (ref 31.5–35.7)
MCV: 90 fL (ref 79–97)
Monocytes Absolute: 0.5 10*3/uL (ref 0.1–0.9)
Monocytes: 10 %
Neutrophils Absolute: 2.7 10*3/uL (ref 1.4–7.0)
Neutrophils: 58 %
Platelets: 234 10*3/uL (ref 150–450)
RBC: 5.37 x10E6/uL (ref 4.14–5.80)
RDW: 12.3 % (ref 11.6–15.4)
WBC: 4.7 10*3/uL (ref 3.4–10.8)

## 2022-02-03 LAB — PSA: Prostate Specific Ag, Serum: 5.3 ng/mL — ABNORMAL HIGH (ref 0.0–4.0)

## 2022-02-03 LAB — HEPATITIS C ANTIBODY: Hep C Virus Ab: NONREACTIVE

## 2022-02-03 LAB — TSH: TSH: 1.46 u[IU]/mL (ref 0.450–4.500)

## 2022-02-03 NOTE — Progress Notes (Signed)
Good Morning. It was nice to see you yesterday.  Your lab work looks good.  Your PSA is elevated from prior.  I recommend following up with Urology for further evaluation.  No other concerns at this time. Continue with your current medication regimen.  Follow up as discussed.  Please let me know if you have any questions.

## 2022-02-13 ENCOUNTER — Ambulatory Visit
Admission: RE | Admit: 2022-02-13 | Discharge: 2022-02-13 | Disposition: A | Discharge status: Home / Self Care | Payer: Medicare Other | Attending: Gastroenterology | Admitting: Gastroenterology

## 2022-02-13 ENCOUNTER — Encounter
Admission: RE | Disposition: A | Discharge status: Home / Self Care | Payer: Self-pay | Source: Home / Self Care | Attending: Gastroenterology

## 2022-02-13 ENCOUNTER — Ambulatory Visit: Payer: Medicare Other | Admitting: Anesthesiology

## 2022-02-13 ENCOUNTER — Encounter: Payer: Self-pay | Admitting: Gastroenterology

## 2022-02-13 DIAGNOSIS — K219 Gastro-esophageal reflux disease without esophagitis: Secondary | ICD-10-CM | POA: Diagnosis not present

## 2022-02-13 DIAGNOSIS — D123 Benign neoplasm of transverse colon: Secondary | ICD-10-CM | POA: Diagnosis not present

## 2022-02-13 DIAGNOSIS — Z79899 Other long term (current) drug therapy: Secondary | ICD-10-CM | POA: Insufficient documentation

## 2022-02-13 DIAGNOSIS — D126 Benign neoplasm of colon, unspecified: Secondary | ICD-10-CM | POA: Diagnosis not present

## 2022-02-13 DIAGNOSIS — I10 Essential (primary) hypertension: Secondary | ICD-10-CM | POA: Insufficient documentation

## 2022-02-13 DIAGNOSIS — K635 Polyp of colon: Secondary | ICD-10-CM | POA: Diagnosis not present

## 2022-02-13 DIAGNOSIS — D125 Benign neoplasm of sigmoid colon: Secondary | ICD-10-CM | POA: Insufficient documentation

## 2022-02-13 DIAGNOSIS — Z1211 Encounter for screening for malignant neoplasm of colon: Secondary | ICD-10-CM | POA: Diagnosis not present

## 2022-02-13 DIAGNOSIS — K5730 Diverticulosis of large intestine without perforation or abscess without bleeding: Secondary | ICD-10-CM | POA: Diagnosis not present

## 2022-02-13 DIAGNOSIS — D175 Benign lipomatous neoplasm of intra-abdominal organs: Secondary | ICD-10-CM | POA: Insufficient documentation

## 2022-02-13 HISTORY — PX: COLONOSCOPY WITH PROPOFOL: SHX5780

## 2022-02-13 SURGERY — COLONOSCOPY WITH PROPOFOL
Anesthesia: General

## 2022-02-13 MED ORDER — PROPOFOL 10 MG/ML IV BOLUS
INTRAVENOUS | Status: DC | PRN
  Administered 2022-02-13: 100 mg via INTRAVENOUS

## 2022-02-13 MED ORDER — SODIUM CHLORIDE 0.9 % IV SOLN
INTRAVENOUS | Status: DC
  Administered 2022-02-13: 1000 mL via INTRAVENOUS

## 2022-02-13 MED ORDER — LIDOCAINE 2% (20 MG/ML) 5 ML SYRINGE
INTRAMUSCULAR | Status: DC | PRN
  Administered 2022-02-13: 50 mg via INTRAVENOUS

## 2022-02-13 MED ORDER — PROPOFOL 500 MG/50ML IV EMUL
INTRAVENOUS | Status: DC | PRN
  Administered 2022-02-13: 200 ug/kg/min via INTRAVENOUS

## 2022-02-13 NOTE — Anesthesia Postprocedure Evaluation (Signed)
Anesthesia Post Note  Patient: Juan West  Procedure(s) Performed: COLONOSCOPY WITH PROPOFOL  Patient location during evaluation: PACU Anesthesia Type: General Level of consciousness: awake and awake and alert Pain management: pain level controlled Vital Signs Assessment: post-procedure vital signs reviewed and stable Respiratory status: spontaneous breathing and nonlabored ventilation Cardiovascular status: stable Anesthetic complications: no  No notable events documented.   Last Vitals:  Vitals:   02/13/22 0910 02/13/22 0920  BP: 110/72 110/77  Pulse: 63 61  Resp: 16 15  Temp: (!) 35.7 C   SpO2: 100% 99%    Last Pain:  Vitals:   02/13/22 0920  TempSrc:   PainSc: 0-No pain                 VAN STAVEREN,Chadric Kimberley

## 2022-02-13 NOTE — Op Note (Signed)
[facility] Gastroenterology Patient Name: Juan West Procedure Date: 02/13/2022 8:16 AM MRN: [id number] Account #: [id number] Date of Birth: May 05, 1950 Admit Type: Outpatient Age: 72 Room: [facility] ENDO ROOM 4 Gender: Male Note Status: Finalized Instrument Name: Park Meo [id number] Procedure:             Colonoscopy Indications:           Screening for colorectal malignant neoplasm, Last                         colonoscopy 10 years ago Providers:             Lin Landsman MD, MD Referring MD:          Jon Billings (Referring MD) Medicines:             General Anesthesia Complications:         No immediate complications. Estimated blood loss: None. Procedure:             Pre-Anesthesia Assessment:                        - Prior to the procedure, a History and Physical was                         performed, and patient medications and allergies were                         reviewed. The patient is competent. The risks and                         benefits of the procedure and the sedation options and                         risks were discussed with the patient. All questions                         were answered and informed consent was obtained.                         Patient identification and proposed procedure were                         verified by the physician, the nurse, the                         anesthesiologist, the anesthetist and the technician                         in the pre-procedure area in the procedure room in the                         endoscopy suite. Mental Status Examination: alert and                         oriented. Airway Examination: normal oropharyngeal                         airway and neck mobility. Respiratory Examination:  clear to auscultation. CV Examination: normal.                         Prophylactic Antibiotics: The patient does not require                         prophylactic antibiotics.  Prior Anticoagulants: The                         patient has taken no anticoagulant or antiplatelet                         agents. ASA Grade Assessment: II - A patient with mild                         systemic disease. After reviewing the risks and                         benefits, the patient was deemed in satisfactory                         condition to undergo the procedure. The anesthesia                         plan was to use general anesthesia. Immediately prior                         to administration of medications, the patient was                         re-assessed for adequacy to receive sedatives. The                         heart rate, respiratory rate, oxygen saturations,                         blood pressure, adequacy of pulmonary ventilation, and                         response to care were monitored throughout the                         procedure. The physical status of the patient was                         re-assessed after the procedure.                        After obtaining informed consent, the colonoscope was                         passed under direct vision. Throughout the procedure,                         the patient's blood pressure, pulse, and oxygen                         saturations were monitored continuously. The  Colonoscope was introduced through the anus and                         advanced to the the cecum, identified by appendiceal                         orifice and ileocecal valve. The colonoscopy was                         performed without difficulty. The patient tolerated                         the procedure well. The quality of the bowel                         preparation was evaluated using the BBPS [employer] Bowel                         Preparation Scale) with scores of: Right Colon = 3,                         Transverse Colon = 3 and Left Colon = 3 (entire mucosa                         seen well with no  residual staining, small fragments                         of stool or opaque liquid). The total BBPS score                         equals 9. The ileocecal valve, appendiceal orifice,                         and rectum were photographed. Findings:      The perianal and digital rectal examinations were normal. Pertinent       negatives include normal sphincter tone and no palpable rectal lesions.      A 4 mm polyp was found in the ascending colon. The polyp was sessile.       The polyp was removed with a cold snare. Resection and retrieval were       complete. Estimated blood loss: none.      Five sessile polyps were found in the transverse colon. The polyps were       3 to 5 mm in size. These polyps were removed with a cold snare.       Resection and retrieval were complete. Estimated blood loss: none.      Two sessile polyps were found in the sigmoid colon. The polyps were 3 to       5 mm in size. These polyps were removed with a cold snare. Resection and       retrieval were complete. Estimated blood loss: none.      A few large-mouthed diverticula were found in the ascending colon.      The retroflexed view of the distal rectum and anal verge was normal and       showed no anal or rectal abnormalities. Impression:            - One 4  mm polyp in the ascending colon, removed with                         a cold snare. Resected and retrieved.                        - Five 3 to 5 mm polyps in the transverse colon,                         removed with a cold snare. Resected and retrieved.                        - Two 3 to 5 mm polyps in the sigmoid colon, removed                         with a cold snare. Resected and retrieved.                        - Diverticulosis in the ascending colon.                        - The distal rectum and anal verge are normal on                         retroflexion view. Recommendation:        - Discharge patient to home (with escort).                         - Resume previous diet today.                        - Continue present medications.                        - Await pathology results.                        - Repeat colonoscopy in 3 years for surveillance of                         multiple polyps. Procedure Code(s):     --- Professional ---                        [phone], Colonoscopy, flexible; with removal of                         tumor(s), polyp(s), or other lesion(s) by snare                         technique Diagnosis Code(s):     --- Professional ---                        Z12.11, Encounter for screening for malignant neoplasm                         of colon                        D12.2, Benign neoplasm of  ascending colon                        D12.3, Benign neoplasm of transverse colon (hepatic                         flexure or splenic flexure)                        D12.5, Benign neoplasm of sigmoid colon                        K57.30, Diverticulosis of large intestine without                         perforation or abscess without bleeding CPT copyright 2022 American Medical Association. All rights reserved. The codes documented in this report are preliminary and upon coder review may  be revised to meet current compliance requirements. Dr. Ulyess Mort Lin Landsman MD, MD 02/13/2022 9:01:50 AM This report has been signed electronically. Number of Addenda: 0 Note Initiated On: 02/13/2022 8:16 AM Scope Withdrawal Time: 0 hours 17 minutes 11 seconds  Total Procedure Duration: 0 hours 22 minutes 14 seconds  Estimated Blood Loss:  Estimated blood loss: none.      [facility]

## 2022-02-13 NOTE — H&P (Signed)
Cephas Darby, MD [address]  [place]  [place], [place] [address]  Main: [phone]  Fax: [phone] Pager: [phone]  Primary Care Physician:  Jon Billings, NP Primary Gastroenterologist:  Dr. Cephas Darby  Pre-Procedure History & Physical: HPI:  Juan West is a 72 y.o. male is here for an colonoscopy.   Past Medical History:  Diagnosis Date   Allergy    Cancer ([place])    GERD (gastroesophageal reflux disease)     Past Surgical History:  Procedure Laterality Date   BASAL CELL CARCINOMA EXCISION     FRACTURE SURGERY     HERNIA REPAIR     NO PAST SURGERIES     PTERYGIUM EXCISION     SQUAMOUS CELL CARCINOMA EXCISION      Prior to Admission medications   Medication Sig Start Date End Date Taking? Authorizing Provider  cetirizine (ZYRTEC) 10 MG tablet Take 1 tablet (10 mg total) by mouth daily. 04/13/21  Yes Jon Billings, NP  lisinopril-hydrochlorothiazide (ZESTORETIC) 10-12.5 MG tablet Take 1 tablet by mouth daily. 02/02/22  Yes Jon Billings, NP  omeprazole (PRILOSEC) 10 MG capsule Take 1 capsule (10 mg total) by mouth daily. 02/02/22  Yes Jon Billings, NP    Allergies as of 02/02/2022 - Review Complete 02/02/2022  Allergen Reaction Noted   Codeine Other (See Comments) 10/25/2012   Melatonin Other (See Comments) 07/08/2020   Trazodone and nefazodone Nausea And Vomiting 07/08/2020    Family History  Problem Relation Age of Onset   Stroke Mother    Cancer Father    Stroke Brother    Autism Granddaughter     Social History   Socioeconomic History   Marital status: Divorced    Spouse name: Not on file   Number of children: Not on file   Years of education: Not on file   Highest education level: Not on file  Occupational History   Not on file  Tobacco Use   Smoking status: Never   Smokeless tobacco: Never  Vaping Use   Vaping Use: Never used  Substance and Sexual Activity   Alcohol use: Yes    Comment: on  occasion   Drug use: Never   Sexual activity: Not Currently  Other Topics Concern   Not on file  Social History Narrative   Not on file   Social Determinants of Health   Financial Resource Strain: Not on file  Food Insecurity: Not on file  Transportation Needs: Not on file  Physical Activity: Not on file  Stress: Not on file  Social Connections: Not on file  Intimate Partner Violence: Not on file    Review of Systems: See HPI, otherwise negative ROS  Physical Exam: BP 111/81   Pulse 72   Temp (!) 96.2 F (35.7 C) (Temporal)   Resp 18   Ht 6' (1.829 m)   Wt 100.4 kg   SpO2 100%   BMI 30.03 kg/m  General:   Alert,  pleasant and cooperative in NAD Head:  Normocephalic and atraumatic. Neck:  Supple; no masses or thyromegaly. Lungs:  Clear throughout to auscultation.    Heart:  Regular rate and rhythm. Abdomen:  Soft, nontender and nondistended. Normal bowel sounds, without guarding, and without rebound.   Neurologic:  Alert and  oriented x4;  grossly normal neurologically.  Impression/Plan: Juan West is here for an colonoscopy to be performed for colon cancer screening  Risks, benefits, limitations, and alternatives regarding  colonoscopy have been reviewed with  the patient.  Questions have been answered.  All parties agreeable.   Sherri Sear, MD  02/13/2022, 8:13 AM

## 2022-02-13 NOTE — Anesthesia Preprocedure Evaluation (Signed)
Anesthesia Evaluation  Patient identified by MRN, date of birth, ID band Patient awake    Reviewed: Allergy & Precautions, NPO status , Patient's Chart, lab work & pertinent test results  Airway Mallampati: II  TM Distance: >3 FB Neck ROM: full    Dental  (+) Teeth Intact, Caps   Pulmonary neg pulmonary ROS   Pulmonary exam normal breath sounds clear to auscultation       Cardiovascular Exercise Tolerance: Good hypertension, Pt. on medications negative cardio ROS Normal cardiovascular exam Rhythm:Regular     Neuro/Psych negative neurological ROS  negative psych ROS   GI/Hepatic negative GI ROS, Neg liver ROS,GERD  Medicated,,  Endo/Other  negative endocrine ROS    Renal/GU negative Renal ROS  negative genitourinary   Musculoskeletal   Abdominal Normal abdominal exam  (+)   Peds negative pediatric ROS (+)  Hematology negative hematology ROS (+)   Anesthesia Other Findings Past Medical History: No date: Allergy No date: Cancer ([place]) No date: GERD (gastroesophageal reflux disease)  Past Surgical History: No date: BASAL CELL CARCINOMA EXCISION No date: FRACTURE SURGERY No date: HERNIA REPAIR No date: NO PAST SURGERIES No date: PTERYGIUM EXCISION No date: SQUAMOUS CELL CARCINOMA EXCISION     Reproductive/Obstetrics negative OB ROS                             Anesthesia Physical Anesthesia Plan  ASA: 2  Anesthesia Plan: General   Post-op Pain Management:    Induction: Intravenous  PONV Risk Score and Plan: Propofol infusion and TIVA  Airway Management Planned: Natural Airway  Additional Equipment:   Intra-op Plan:   Post-operative Plan:   Informed Consent: I have reviewed the patients History and Physical, chart, labs and discussed the procedure including the risks, benefits and alternatives for the proposed anesthesia with the patient or authorized representative who  has indicated his/her understanding and acceptance.     Dental Advisory Given  Plan Discussed with: Anesthesiologist, CRNA and Surgeon  Anesthesia Plan Comments:        Anesthesia Quick Evaluation

## 2022-02-13 NOTE — Transfer of Care (Signed)
Immediate Anesthesia Transfer of Care Note  Patient: Juan West  Procedure(s) Performed: COLONOSCOPY WITH PROPOFOL  Patient Location: Endoscopy Unit  Anesthesia Type:General  Level of Consciousness: awake, alert , and oriented  Airway & Oxygen Therapy: Patient Spontanous Breathing  Post-op Assessment: Report given to RN and Post -op Vital signs reviewed and stable  Post vital signs: Reviewed and stable  Last Vitals:  Vitals Value Taken Time  BP 110/72 02/13/22 0910  Temp 35.7 C 02/13/22 0910  Pulse 65 02/13/22 0912  Resp 16 02/13/22 0912  SpO2 100 % 02/13/22 0912  Vitals shown include unvalidated device data.  Last Pain:  Vitals:   02/13/22 0910  TempSrc: Tympanic  PainSc: Asleep         Complications: No notable events documented.

## 2022-02-14 ENCOUNTER — Encounter: Payer: Self-pay | Admitting: Gastroenterology

## 2022-02-14 LAB — SURGICAL PATHOLOGY

## 2022-02-16 ENCOUNTER — Encounter: Payer: Self-pay | Admitting: Gastroenterology

## 2022-03-09 ENCOUNTER — Encounter: Payer: Self-pay | Admitting: Nurse Practitioner

## 2022-03-10 MED ORDER — HYDROCHLOROTHIAZIDE 12.5 MG PO CAPS: 12.5000 mg | ORAL_CAPSULE | Freq: Every day | ORAL | 0 refills | Status: DC

## 2022-03-10 MED ORDER — LOSARTAN POTASSIUM 25 MG PO TABS: 25.0000 mg | ORAL_TABLET | Freq: Every day | ORAL | 0 refills | Status: DC

## 2022-03-16 ENCOUNTER — Encounter: Payer: Self-pay | Admitting: Nurse Practitioner

## 2022-03-31 ENCOUNTER — Telehealth: Payer: Self-pay | Admitting: Nurse Practitioner

## 2022-04-04 ENCOUNTER — Encounter: Payer: Self-pay | Admitting: Nurse Practitioner

## 2022-04-04 ENCOUNTER — Ambulatory Visit (INDEPENDENT_AMBULATORY_CARE_PROVIDER_SITE_OTHER): Payer: Medicare Other | Admitting: Nurse Practitioner

## 2022-04-04 VITALS — BP 135/70 | HR 67 | Temp 97.7°F | Wt 236.3 lb

## 2022-04-04 DIAGNOSIS — I10 Essential (primary) hypertension: Secondary | ICD-10-CM | POA: Diagnosis not present

## 2022-04-04 DIAGNOSIS — Z85828 Personal history of other malignant neoplasm of skin: Secondary | ICD-10-CM | POA: Diagnosis not present

## 2022-04-04 DIAGNOSIS — D0439 Carcinoma in situ of skin of other parts of face: Secondary | ICD-10-CM | POA: Diagnosis not present

## 2022-04-04 DIAGNOSIS — D2262 Melanocytic nevi of left upper limb, including shoulder: Secondary | ICD-10-CM | POA: Diagnosis not present

## 2022-04-04 DIAGNOSIS — D485 Neoplasm of uncertain behavior of skin: Secondary | ICD-10-CM | POA: Diagnosis not present

## 2022-04-04 DIAGNOSIS — D2261 Melanocytic nevi of right upper limb, including shoulder: Secondary | ICD-10-CM | POA: Diagnosis not present

## 2022-04-04 DIAGNOSIS — D225 Melanocytic nevi of trunk: Secondary | ICD-10-CM | POA: Diagnosis not present

## 2022-04-04 DIAGNOSIS — L570 Actinic keratosis: Secondary | ICD-10-CM | POA: Diagnosis not present

## 2022-04-04 MED ORDER — OMEPRAZOLE 10 MG PO CPDR: 20.0000 mg | DELAYED_RELEASE_CAPSULE | Freq: Every day | ORAL | 1 refills | Status: DC

## 2022-04-04 NOTE — Progress Notes (Signed)
BP 135/70   Pulse 67   Temp 97.7 F (36.5 C) (Oral)   Wt 236 lb 4.8 oz (107.2 kg)   SpO2 97%   BMI 32.05 kg/m    Subjective:    Patient ID: Juan West, male    DOB: January 27, 1950, 72 y.o.   MRN: [id number]  HPI: Juan West is a 72 y.o. male  Chief Complaint  Patient presents with   Hypertension   HYPERTENSION without Chronic Kidney Disease Hypertension status: controlled  Satisfied with current treatment? yes Duration of hypertension: years BP monitoring frequency:  daily BP range: 120-130/70 BP medication side effects:  no Medication compliance: excellent compliance Previous BP meds:HCTZ and losartan (cozaar) Aspirin: no Recurrent headaches: no Visual changes: no Palpitations: no Dyspnea: no Chest pain: no Lower extremity edema: no Dizzy/lightheaded: no Patient states he was having terrible fatigue while he was taking Losartan.    Relevant past medical, surgical, family and social history reviewed and updated as indicated. Interim medical history since our last visit reviewed. Allergies and medications reviewed and updated.  Review of Systems  Constitutional:  Positive for fatigue.  Eyes:  Negative for visual disturbance.  Respiratory:  Negative for shortness of breath.   Cardiovascular:  Negative for chest pain and leg swelling.  Neurological:  Negative for light-headedness and headaches.    Per HPI unless specifically indicated above     Objective:    BP 135/70   Pulse 67   Temp 97.7 F (36.5 C) (Oral)   Wt 236 lb 4.8 oz (107.2 kg)   SpO2 97%   BMI 32.05 kg/m   Wt Readings from Last 3 Encounters:  04/04/22 236 lb 4.8 oz (107.2 kg)  02/13/22 221 lb 7.2 oz (100.4 kg)  02/02/22 236 lb (107 kg)    Physical Exam Vitals and nursing note reviewed.  Constitutional:      General: He is not in acute distress.    Appearance: Normal appearance. He is not ill-appearing, toxic-appearing or diaphoretic.  HENT:     Head: Normocephalic.      Right Ear: External ear normal.     Left Ear: External ear normal.     Nose: Nose normal. No congestion or rhinorrhea.     Mouth/Throat:     Mouth: Mucous membranes are moist.  Eyes:     General:        Right eye: No discharge.        Left eye: No discharge.     Extraocular Movements: Extraocular movements intact.     Conjunctiva/sclera: Conjunctivae normal.     Pupils: Pupils are equal, round, and reactive to light.  Cardiovascular:     Rate and Rhythm: Normal rate and regular rhythm.     Heart sounds: No murmur heard. Pulmonary:     Effort: Pulmonary effort is normal. No respiratory distress.     Breath sounds: Normal breath sounds. No wheezing, rhonchi or rales.  Abdominal:     General: Abdomen is flat. Bowel sounds are normal.  Musculoskeletal:     Cervical back: Normal range of motion and neck supple.  Skin:    General: Skin is warm and dry.     Capillary Refill: Capillary refill takes less than 2 seconds.  Neurological:     General: No focal deficit present.     Mental Status: He is alert and oriented to person, place, and time.  Psychiatric:        Mood and Affect: Mood normal.  Behavior: Behavior normal.        Thought Content: Thought content normal.        Judgment: Judgment normal.     Results for orders placed or performed during the hospital encounter of 02/13/22  Surgical pathology  Result Value Ref Range   SURGICAL PATHOLOGY      SURGICAL PATHOLOGY CASE: [phone] PATIENT: Adaiah Dante Surgical Pathology Report     Specimen Submitted: A. Colon polyp, ascending; cold snare B. Colon polyp x5, transverse; cold snare C. Colon polyp x2, sigmoid; cold snare  Clinical History: Screening colonoscopy.  Colon polyps      DIAGNOSIS: A.  COLON POLYP, ASCENDING; COLD SNARE: - SUBMUCOSAL LIPOMA. - NEGATIVE FOR DYSPLASIA AND MALIGNANCY.  B.  COLON POLYP X 5, TRANSVERSE; COLD SNARE: - TUBULAR ADENOMA (MULTIPLE FRAGMENTS). - NEGATIVE FOR  HIGH-GRADE DYSPLASIA AND MALIGNANCY.  C.  COLON POLYP X 2, SIGMOID; COLD SNARE: - TUBULAR ADENOMA (1). - HYPERPLASTIC POLYP (1). - NEGATIVE FOR HIGH-GRADE DYSPLASIA AND MALIGNANCY.  GROSS DESCRIPTION: A. Labeled: Cold snared ascending colon polyp Received: Formalin Collection time: 8:05 AM on 02/13/2022 Placed into formalin time: 8:05 AM on 02/13/2022 Tissue fragment(s): 1 Size: 0.5 x 0.4 x 0.1 cm Description: Tan soft tissue fragment Entirely submitte d in 1 cassette.  B. Labeled: Cold snare transverse colon polyps x 5 Received: Formalin Collection time: 8:48 AM on 02/13/2022 Placed into formalin time: 8:48 AM on 02/13/2022 Tissue fragment(s): Multiple Size: Aggregate, 1.2 x 0.9 x 0.1 cm Description: Tan to pink soft tissue fragments Entirely submitted in 1 cassette.  C. Labeled: Cold snared sigmoid colon polyps x 2 Received: Formalin Collection time: 9:00 AM on 02/13/2022 Placed into formalin time: 9:00 AM on 02/13/2022 Tissue fragment(s): 2 Size: Ranges from 0.6-1.0 cm Description: Pink soft tissue fragments Entirely submitted in 1 cassette.  CM 02/13/2022  Final Diagnosis performed by Quay Burow, MD.   Electronically signed 02/14/2022 9:35:04AM The electronic signature indicates that the named Attending Pathologist has evaluated the specimen Technical component performed at [facility], [address], [place], [place] [address] Lab: [phone] Dir: Rush Farmer, MD, MMM  Professional component performed at [facility], [facility], [place], [place],  [address] Lab: [phone] Dir: Kathi Simpers, MD       Assessment & Plan:   Problem List Items Addressed This Visit       Cardiovascular and Mediastinum   Primary hypertension - Primary    Chronic.  Ongoing.  Patient was having a cough with Lisinopril.  Medication was changed to Losartan.  Patient having significant fatigue with Losartan.  He stopped the medication a couple of days ago  and fatigue improved. Blood pressure at home is running 120-130/80.  Will continue with HCTZ to help control blood pressure.  Follow up in 6 weeks.  Call sooner if concerns arise.         Follow up plan: Return in about 6 weeks (around 05/16/2022) for BP Check.

## 2022-04-04 NOTE — Assessment & Plan Note (Addendum)
Chronic.  Ongoing.  Patient was having a cough with Lisinopril.  Medication was changed to Losartan.  Patient having significant fatigue with Losartan.  He stopped the medication a couple of days ago and fatigue improved. Blood pressure at home is running 120-130/80.  Will continue with HCTZ to help control blood pressure.  Follow up in 6 weeks.  Call sooner if concerns arise.

## 2022-04-05 ENCOUNTER — Encounter: Payer: Self-pay | Admitting: Nurse Practitioner

## 2022-04-06 MED ORDER — OMEPRAZOLE 10 MG PO CPDR: 20.0000 mg | DELAYED_RELEASE_CAPSULE | Freq: Every day | ORAL | 5 refills | Status: DC

## 2022-04-06 MED ORDER — CETIRIZINE HCL 10 MG PO TABS: 10.0000 mg | ORAL_TABLET | Freq: Every day | ORAL | 5 refills | Status: AC

## 2022-04-06 MED ORDER — HYDROCHLOROTHIAZIDE 12.5 MG PO CAPS: 12.5000 mg | ORAL_CAPSULE | Freq: Every day | ORAL | 5 refills | Status: DC

## 2022-04-14 DIAGNOSIS — L905 Scar conditions and fibrosis of skin: Secondary | ICD-10-CM | POA: Diagnosis not present

## 2022-04-14 DIAGNOSIS — D225 Melanocytic nevi of trunk: Secondary | ICD-10-CM | POA: Diagnosis not present

## 2022-04-24 ENCOUNTER — Ambulatory Visit: Payer: Medicare Other | Admitting: Nurse Practitioner

## 2022-05-09 ENCOUNTER — Encounter: Payer: Self-pay | Admitting: Nurse Practitioner

## 2022-05-17 ENCOUNTER — Ambulatory Visit (INDEPENDENT_AMBULATORY_CARE_PROVIDER_SITE_OTHER): Payer: Medicare Other | Admitting: Nurse Practitioner

## 2022-05-17 ENCOUNTER — Encounter: Payer: Self-pay | Admitting: Nurse Practitioner

## 2022-05-17 VITALS — BP 137/65 | HR 64 | Temp 98.1°F | Wt 235.0 lb

## 2022-05-17 DIAGNOSIS — I10 Essential (primary) hypertension: Secondary | ICD-10-CM | POA: Diagnosis not present

## 2022-05-17 MED ORDER — AMLODIPINE BESYLATE 5 MG PO TABS: 5.0000 mg | ORAL_TABLET | Freq: Every day | ORAL | 0 refills | Status: DC

## 2022-05-17 NOTE — Assessment & Plan Note (Signed)
Chronic. Not well controlled.  Will add amlodipine  daily.  Continue with HCTZ 12.5mg .  Side effects and benefits of medication discussed during visit.  Follow up in 1 month.  Call sooner if concerns arise.  Continue to check blood pressures at home.

## 2022-05-17 NOTE — Progress Notes (Signed)
BP 137/65   Pulse 64   Temp 98.1 F (36.7 C) (Oral)   Wt 235 lb (106.6 kg)   SpO2 97%   BMI 31.87 kg/m    Subjective:    Patient ID: Juan West, male    DOB: October 06, 1950, 72 y.o.   MRN: [id number]  HPI: Juan West is a 72 y.o. male  Chief Complaint  Patient presents with   Hypertension   HYPERTENSION without Chronic Kidney Disease Hypertension status: uncontrolled  Satisfied with current treatment? yes Duration of hypertension: years BP monitoring frequency:  daily BP range: 160/80 BP medication side effects:  no Medication compliance: excellent compliance Previous BP meds:HCTZ Aspirin: no Recurrent headaches: no Visual changes: no Palpitations: no Dyspnea: yes Chest pain: no Lower extremity edema: no Dizzy/lightheaded: no  Relevant past medical, surgical, family and social history reviewed and updated as indicated. Interim medical history since our last visit reviewed. Allergies and medications reviewed and updated.  Review of Systems  Eyes:  Negative for visual disturbance.  Respiratory:  Negative for shortness of breath.   Cardiovascular:  Negative for chest pain and leg swelling.  Neurological:  Negative for light-headedness and headaches.    Per HPI unless specifically indicated above     Objective:    BP 137/65   Pulse 64   Temp 98.1 F (36.7 C) (Oral)   Wt 235 lb (106.6 kg)   SpO2 97%   BMI 31.87 kg/m   Wt Readings from Last 3 Encounters:  05/17/22 235 lb (106.6 kg)  04/04/22 236 lb 4.8 oz (107.2 kg)  02/13/22 221 lb 7.2 oz (100.4 kg)    Physical Exam Vitals and nursing note reviewed.  Constitutional:      General: He is not in acute distress.    Appearance: Normal appearance. He is not ill-appearing, toxic-appearing or diaphoretic.  HENT:     Head: Normocephalic.     Right Ear: External ear normal.     Left Ear: External ear normal.     Nose: Nose normal. No congestion or rhinorrhea.     Mouth/Throat:     Mouth:  Mucous membranes are moist.  Eyes:     General:        Right eye: No discharge.        Left eye: No discharge.     Extraocular Movements: Extraocular movements intact.     Conjunctiva/sclera: Conjunctivae normal.     Pupils: Pupils are equal, round, and reactive to light.  Cardiovascular:     Rate and Rhythm: Normal rate and regular rhythm.     Heart sounds: No murmur heard. Pulmonary:     Effort: Pulmonary effort is normal. No respiratory distress.     Breath sounds: Normal breath sounds. No wheezing, rhonchi or rales.  Abdominal:     General: Abdomen is flat. Bowel sounds are normal.  Musculoskeletal:     Cervical back: Normal range of motion and neck supple.  Skin:    General: Skin is warm and dry.     Capillary Refill: Capillary refill takes less than 2 seconds.  Neurological:     General: No focal deficit present.     Mental Status: He is alert and oriented to person, place, and time.  Psychiatric:        Mood and Affect: Mood normal.        Behavior: Behavior normal.        Thought Content: Thought content normal.  Judgment: Judgment normal.     Results for orders placed or performed during the hospital encounter of 02/13/22  Surgical pathology  Result Value Ref Range   SURGICAL PATHOLOGY      SURGICAL PATHOLOGY CASE: [id number] PATIENT: Jill Escalona Surgical Pathology Report     Specimen Submitted: A. Colon polyp, ascending; cold snare B. Colon polyp x5, transverse; cold snare C. Colon polyp x2, sigmoid; cold snare  Clinical History: Screening colonoscopy.  Colon polyps      DIAGNOSIS: A.  COLON POLYP, ASCENDING; COLD SNARE: - SUBMUCOSAL LIPOMA. - NEGATIVE FOR DYSPLASIA AND MALIGNANCY.  B.  COLON POLYP X 5, TRANSVERSE; COLD SNARE: - TUBULAR ADENOMA (MULTIPLE FRAGMENTS). - NEGATIVE FOR HIGH-GRADE DYSPLASIA AND MALIGNANCY.  C.  COLON POLYP X 2, SIGMOID; COLD SNARE: - TUBULAR ADENOMA (1). - HYPERPLASTIC POLYP (1). - NEGATIVE FOR  HIGH-GRADE DYSPLASIA AND MALIGNANCY.  GROSS DESCRIPTION: A. Labeled: Cold snared ascending colon polyp Received: Formalin Collection time: 8:05 AM on 02/13/2022 Placed into formalin time: 8:05 AM on 02/13/2022 Tissue fragment(s): 1 Size: 0.5 x 0.4 x 0.1 cm Description: Tan soft tissue fragment Entirely submitte d in 1 cassette.  B. Labeled: Cold snare transverse colon polyps x 5 Received: Formalin Collection time: 8:48 AM on 02/13/2022 Placed into formalin time: 8:48 AM on 02/13/2022 Tissue fragment(s): Multiple Size: Aggregate, 1.2 x 0.9 x 0.1 cm Description: Tan to pink soft tissue fragments Entirely submitted in 1 cassette.  C. Labeled: Cold snared sigmoid colon polyps x 2 Received: Formalin Collection time: 9:00 AM on 02/13/2022 Placed into formalin time: 9:00 AM on 02/13/2022 Tissue fragment(s): 2 Size: Ranges from 0.6-1.0 cm Description: Pink soft tissue fragments Entirely submitted in 1 cassette.  CM 02/13/2022  Final Diagnosis performed by Elijah Birk, MD.   Electronically signed 02/14/2022 9:35:04AM The electronic signature indicates that the named Attending Pathologist has evaluated the specimen Technical component performed at [facility], [address] [place], [place] [address] Lab: [phone] Dir: Jolene Schimke, MD, MMM  Professional component performed at [facility], [address] [place], [place], [place] [address] Lab: [phone] Dir: Beryle Quant, MD       Assessment & Plan:   Problem List Items Addressed This Visit       Cardiovascular and Mediastinum   Primary hypertension - Primary    Chronic. Not well controlled.  Will add amlodipine  daily.  Continue with HCTZ 12.5mg .  Side effects and benefits of medication discussed during visit.  Follow up in 1 month.  Call sooner if concerns arise.  Continue to check blood pressures at home.       Relevant Medications   amLODipine (NORVASC) 5 MG tablet     Follow up  plan: Return in about 1 month (around 06/16/2022) for BP Check.

## 2022-05-18 DIAGNOSIS — D0439 Carcinoma in situ of skin of other parts of face: Secondary | ICD-10-CM | POA: Diagnosis not present

## 2022-05-18 DIAGNOSIS — L905 Scar conditions and fibrosis of skin: Secondary | ICD-10-CM | POA: Diagnosis not present

## 2022-05-18 DIAGNOSIS — D2339 Other benign neoplasm of skin of other parts of face: Secondary | ICD-10-CM | POA: Diagnosis not present

## 2022-06-04 ENCOUNTER — Other Ambulatory Visit: Payer: Self-pay | Admitting: Nurse Practitioner

## 2022-06-05 NOTE — Telephone Encounter (Signed)
Medication no long listed on current medication list Requested Prescriptions  Pending Prescriptions Disp Refills   losartan (COZAAR) 25 MG tablet [Pharmacy Med Name: LOSARTAN POTASSIUM 25 MG TAB] 90 tablet 0    Sig: TAKE 1 TABLET BY MOUTH DAILY     Cardiovascular:  Angiotensin Receptor Blockers Passed - 06/04/2022  6:52 AM      Passed - Cr in normal range and within 180 days    Creatinine, Ser  Date Value Ref Range Status  02/02/2022 1.21 0.76 - 1.27 mg/dL Final         Passed - K in normal range and within 180 days    Potassium  Date Value Ref Range Status  02/02/2022 4.1 3.5 - 5.2 mmol/L Final         Passed - Patient is not pregnant      Passed - Last BP in normal range    BP Readings from Last 1 Encounters:  05/17/22 137/65         Passed - Valid encounter within last 6 months    Recent Outpatient Visits           2 weeks ago Primary hypertension   [place] [facility] Larae Grooms, NP   2 months ago Primary hypertension   [place] [facility] Larae Grooms, NP   4 months ago Encounter for annual wellness exam in Medicare patient   [facility] Larae Grooms, NP   10 months ago Primary hypertension   [facility] Larae Grooms, NP   1 year ago Viral upper respiratory tract infection   [facility] Larae Grooms, NP       Future Appointments             In 1 week Larae Grooms, NP [facility]   In 1 month Larae Grooms, NP [place] [facility]

## 2022-06-09 ENCOUNTER — Ambulatory Visit: Payer: Medicare Other | Admitting: Urology

## 2022-06-09 ENCOUNTER — Other Ambulatory Visit: Payer: Medicare Other

## 2022-06-09 DIAGNOSIS — R972 Elevated prostate specific antigen [PSA]: Secondary | ICD-10-CM | POA: Diagnosis not present

## 2022-06-10 LAB — PSA: Prostate Specific Ag, Serum: 5 ng/mL — ABNORMAL HIGH (ref 0.0–4.0)

## 2022-06-13 ENCOUNTER — Ambulatory Visit: Payer: Medicare Other | Admitting: Urology

## 2022-06-13 VITALS — BP 130/78 | HR 75 | Ht 72.0 in | Wt 232.4 lb

## 2022-06-13 DIAGNOSIS — C61 Malignant neoplasm of prostate: Secondary | ICD-10-CM

## 2022-06-13 NOTE — Patient Instructions (Signed)
Prostate MRI Prep:  1- No ejaculation 48 hours prior to exam  2- No caffeine or carbonated beverages on day of the exam  3- Eat light diet evening prior and day of exam  4- Avoid eating 4 hours prior to exam  5- Fleets enema needs to be done 4 hours prior to exam -See below. Can be purchased at the drug store.      

## 2022-06-13 NOTE — Progress Notes (Signed)
I,Amy L Pierron,acting as a scribe for Vanna Scotland, MD.,have documented all relevant documentation on the behalf of Vanna Scotland, MD,as directed by  Vanna Scotland, MD while in the presence of Vanna Scotland, MD.  06/13/2022 1:34 PM   Juan West 1950/07/14 [id number]  Referring provider: Larae Grooms, NP [address] 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  He underwent a negative hematuria evaluation in 2023 indicating some mild prostamegaly and non-obstructing small stones.  He is s/p prostate biopsy on 06/02/2021. TRUS 56.4. Surgical pathology revealed Gleason 3+3 involving 2/12 cores affecting up to 20% in the left lateral base.   His PSA on 06/08/2021 was 5.0 which is relatively stable.   He is doing well overall with no new symptoms or complaints. He was curious why the cancer results showed up in his chart but wasn't able to speak with [place] about it until 5 days later. Explained the doctors don't have control over that and wish it didn't happen as well since they may not have seen the information as soon as they would like.    Surgical pathology  [A] PROSTATE, LEFT BASE:   NEGATIVE FOR MALIGNANCY.   [B] PROSTATE, LEFT MID:   NEGATIVE FOR MALIGNANCY.   [C] PROSTATE, LEFT APEX:   NEGATIVE FOR MALIGNANCY.   [D] PROSTATE, RIGHT BASE:   NEGATIVE FOR MALIGNANCY.   [E] PROSTATE, RIGHT MID:   NEGATIVE FOR MALIGNANCY.   [F] PROSTATE, RIGHT APEX:   NEGATIVE FOR MALIGNANCY.   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   [I] PROSTATE,  LEFT LATERAL APEX:   NEGATIVE FOR MALIGNANCY.   [J] PROSTATE, RIGHT LATERAL BASE:   NEGATIVE FOR MALIGNANCY.   [K] PROSTATE, RIGHT LATERAL MID:   NEGATIVE FOR MALIGNANCY.   [L] PROSTATE, RIGHT LATERAL APEX:   NEGATIVE FOR MALIGNANCY.     PSA Trend: 08/09/2018     4.3 11/14/2018   3.7 04/12/2021     4.5 12/09/2021   3.8 02/02/2022     5.3 06/09/2022     5.0   PMH: Past Medical History:  Diagnosis Date   Allergy    Cancer (HCC)    GERD (gastroesophageal reflux disease)     Surgical History: Past Surgical History:  Procedure Laterality Date   BASAL CELL CARCINOMA EXCISION     COLONOSCOPY WITH PROPOFOL N/A 02/13/2022   Procedure: COLONOSCOPY WITH PROPOFOL;  Surgeon: Toney Reil, MD;  Location: [facility] ENDOSCOPY;  Service: Gastroenterology;  Laterality: N/A;   FRACTURE SURGERY     HERNIA REPAIR     NO PAST SURGERIES     PTERYGIUM EXCISION     SQUAMOUS CELL CARCINOMA EXCISION      Home Medications:  Allergies as of 06/13/2022       Reactions   Codeine Other (See Comments)   hallucinations   Melatonin Other (See Comments)   Drowsiness   Trazodone And Nefazodone Nausea And Vomiting        Medication List  Accurate as of Jun 13, 2022  1:34 PM. If you have any questions, ask your nurse or doctor.          STOP taking these medications    amLODipine 5 MG tablet Commonly known as: NORVASC Stopped by: Vanna Scotland, MD       TAKE these medications    cetirizine 10 MG tablet Commonly known as: ZYRTEC Take 1 tablet (10 mg total) by mouth daily.   hydrochlorothiazide 12.5 MG capsule Commonly known as: MICROZIDE Take 1 capsule (12.5 mg total) by mouth daily.   omeprazole 10 MG capsule Commonly known as: PRILOSEC Take 2 capsules (20 mg total) by mouth daily.        Allergies:  Allergies  Allergen Reactions   Codeine Other (See Comments)    hallucinations   Melatonin Other (See Comments)    Drowsiness    Trazodone And Nefazodone Nausea  And Vomiting    Family History: Family History  Problem Relation Age of Onset   Stroke Mother    Cancer Father    Stroke Brother    Autism Granddaughter     Social History:  reports that he has never smoked. He has never used smokeless tobacco. He reports current alcohol use. He reports that he does not use drugs.   Physical Exam: BP 130/78   Pulse 75   Ht 6' (1.829 m)   Wt 232 lb 6 oz (105.4 kg)   BMI 31.52 kg/m   Constitutional:  Alert and oriented, No acute distress. HEENT: [place] AT, moist mucus membranes.  Trachea midline, no masses. GU: Symmetric prostate; bilateral ridges. Neurologic: Grossly intact, no focal deficits, moving all 4 extremities. Psychiatric: Normal mood and affect.   Assessment & Plan:    Prostate cancer  - Low risk on active surveillance. PSA somewhat fluctuating.   - Based on surveillance protocols would recommend, at minimum, a prostate MRI to establish a baseline. If there's any high-grade or suspicious lesion would consider fusion biopsy. His is in agreement with getting the MRI. Told him he will probably see the results before we do. Mentioned they use a scoring category. If it's a very high score, such as 5 or a 4, it may be best to biopsy that spot. If it's an intermediate or low score, will stay the course and repeat his PSA in six months. He has a few trips coming up so will have it done around a month or so.   Return in about 1 year (around 06/13/2023) for PSA, rectal exam.   [facility] [address], [address] [place], [place] [address] [phone]

## 2022-06-15 ENCOUNTER — Encounter: Payer: Self-pay | Admitting: Nurse Practitioner

## 2022-06-15 ENCOUNTER — Ambulatory Visit (INDEPENDENT_AMBULATORY_CARE_PROVIDER_SITE_OTHER): Payer: Medicare Other | Admitting: Nurse Practitioner

## 2022-06-15 VITALS — BP 137/79 | HR 64 | Temp 97.9°F | Wt 233.0 lb

## 2022-06-15 DIAGNOSIS — I10 Essential (primary) hypertension: Secondary | ICD-10-CM | POA: Diagnosis not present

## 2022-06-15 DIAGNOSIS — Z136 Encounter for screening for cardiovascular disorders: Secondary | ICD-10-CM

## 2022-06-15 MED ORDER — OMEPRAZOLE 20 MG PO CPDR: 20.0000 mg | DELAYED_RELEASE_CAPSULE | Freq: Every day | ORAL | 1 refills | Status: DC

## 2022-06-15 MED ORDER — HYDROCHLOROTHIAZIDE 12.5 MG PO CAPS: 12.5000 mg | ORAL_CAPSULE | Freq: Every day | ORAL | 1 refills | Status: DC

## 2022-06-15 NOTE — Progress Notes (Signed)
BP 137/79   Pulse 64   Temp 97.9 F (36.6 C) (Oral)   Wt 233 lb (105.7 kg)   SpO2 97%   BMI 31.60 kg/m    Subjective:    Patient ID: Juan West, male    DOB: February 07, 1950, 72 y.o.   MRN: [id number]  HPI: Juan West is a 72 y.o. male  Chief Complaint  Patient presents with   Hypertension   HYPERTENSION without Chronic Kidney Disease Hypertension status: uncontrolled  Satisfied with current treatment? yes Duration of hypertension: years BP monitoring frequency:  daily BP range: 130/70 BP medication side effects:  no Medication compliance: excellent compliance Previous BP meds:HCTZ Aspirin: no Recurrent headaches: no Visual changes: no Palpitations: no Dyspnea: yes Chest pain: no Lower extremity edema: no Dizzy/lightheaded: no  Relevant past medical, surgical, family and social history reviewed and updated as indicated. Interim medical history since our last visit reviewed. Allergies and medications reviewed and updated.  Review of Systems  Eyes:  Negative for visual disturbance.  Respiratory:  Negative for shortness of breath.   Cardiovascular:  Negative for chest pain and leg swelling.  Neurological:  Negative for light-headedness and headaches.    Per HPI unless specifically indicated above     Objective:    BP 137/79   Pulse 64   Temp 97.9 F (36.6 C) (Oral)   Wt 233 lb (105.7 kg)   SpO2 97%   BMI 31.60 kg/m   Wt Readings from Last 3 Encounters:  06/15/22 233 lb (105.7 kg)  06/13/22 232 lb 6 oz (105.4 kg)  05/17/22 235 lb (106.6 kg)    Physical Exam Vitals and nursing note reviewed.  Constitutional:      General: He is not in acute distress.    Appearance: Normal appearance. He is not ill-appearing, toxic-appearing or diaphoretic.  HENT:     Head: Normocephalic.     Right Ear: External ear normal.     Left Ear: External ear normal.     Nose: Nose normal. No congestion or rhinorrhea.     Mouth/Throat:     Mouth: Mucous  membranes are moist.  Eyes:     General:        Right eye: No discharge.        Left eye: No discharge.     Extraocular Movements: Extraocular movements intact.     Conjunctiva/sclera: Conjunctivae normal.     Pupils: Pupils are equal, round, and reactive to light.  Cardiovascular:     Rate and Rhythm: Normal rate and regular rhythm.     Heart sounds: No murmur heard. Pulmonary:     Effort: Pulmonary effort is normal. No respiratory distress.     Breath sounds: Normal breath sounds. No wheezing, rhonchi or rales.  Abdominal:     General: Abdomen is flat. Bowel sounds are normal.  Musculoskeletal:     Cervical back: Normal range of motion and neck supple.  Skin:    General: Skin is warm and dry.     Capillary Refill: Capillary refill takes less than 2 seconds.  Neurological:     General: No focal deficit present.     Mental Status: He is alert and oriented to person, place, and time.  Psychiatric:        Mood and Affect: Mood normal.        Behavior: Behavior normal.        Thought Content: Thought content normal.        Judgment: Judgment  normal.     Results for orders placed or performed in visit on 06/09/22  PSA  Result Value Ref Range   Prostate Specific Ag, Serum 5.0 (H) 0.0 - 4.0 ng/mL      Assessment & Plan:   Problem List Items Addressed This Visit       Cardiovascular and Mediastinum   Primary hypertension - Primary    Chronic.  Controlled.  Continue with current medication regimen of HCTZ 12.5mg  daily.  Refills sent today.  Return to clinic in 6 months for reevaluation.  Call sooner if concerns arise.        Relevant Medications   hydrochlorothiazide (MICROZIDE) 12.5 MG capsule   Other Relevant Orders   Comp Met (CMET)   Other Visit Diagnoses     Screening for ischemic heart disease       Relevant Orders   Lipid Profile        Follow up plan: Return in about 6 months (around 12/16/2022) for HTN, HLD, DM2 FU.

## 2022-06-15 NOTE — Assessment & Plan Note (Addendum)
Chronic.  Controlled.  Continue with current medication regimen of HCTZ 12.5mg  daily.  Labs ordered today.  Refills sent today.  Return to clinic in 6 months for reevaluation.  Call sooner if concerns arise.

## 2022-06-16 LAB — COMPREHENSIVE METABOLIC PANEL
ALT: 22 IU/L (ref 0–44)
AST: 27 IU/L (ref 0–40)
Albumin/Globulin Ratio: 2.1 (ref 1.2–2.2)
Albumin: 4.8 g/dL (ref 3.8–4.8)
Alkaline Phosphatase: 70 IU/L (ref 44–121)
BUN/Creatinine Ratio: 17 (ref 10–24)
BUN: 17 mg/dL (ref 8–27)
Bilirubin Total: 0.7 mg/dL (ref 0.0–1.2)
CO2: 20 mmol/L (ref 20–29)
Calcium: 9.2 mg/dL (ref 8.6–10.2)
Chloride: 104 mmol/L (ref 96–106)
Creatinine, Ser: 1.01 mg/dL (ref 0.76–1.27)
Globulin, Total: 2.3 g/dL (ref 1.5–4.5)
Glucose: 85 mg/dL (ref 70–99)
Potassium: 4.1 mmol/L (ref 3.5–5.2)
Sodium: 142 mmol/L (ref 134–144)
Total Protein: 7.1 g/dL (ref 6.0–8.5)
eGFR: 79 mL/min/{1.73_m2} (ref >59)

## 2022-06-16 LAB — LIPID PANEL
Chol/HDL Ratio: 3.4 ratio (ref 0.0–5.0)
Cholesterol, Total: 138 mg/dL (ref 100–199)
HDL: 41 mg/dL (ref >39)
LDL Chol Calc (NIH): 83 mg/dL (ref 0–99)
Triglycerides: 71 mg/dL (ref 0–149)
VLDL Cholesterol Cal: 14 mg/dL (ref 5–40)

## 2022-06-16 NOTE — Progress Notes (Signed)
Hi Mr. Haniff. It was nice to see you yesterday.  Your lab work looks good.  No concerns at this time. Continue with your current medication regimen.  Follow up as discussed.  Please let me know if you have any questions.

## 2022-06-20 ENCOUNTER — Ambulatory Visit
Admission: RE | Admit: 2022-06-20 | Discharge: 2022-06-20 | Disposition: A | Discharge status: Home / Self Care | Payer: Medicare Other | Source: Ambulatory Visit | Attending: Urology | Admitting: Urology

## 2022-06-20 DIAGNOSIS — C61 Malignant neoplasm of prostate: Secondary | ICD-10-CM | POA: Insufficient documentation

## 2022-06-20 MED ORDER — GADOBUTROL 1 MMOL/ML IV SOLN
10.0000 mL | Freq: Once | INTRAVENOUS | Status: AC | PRN
  Administered 2022-06-20: 10 mL via INTRAVENOUS

## 2022-06-23 ENCOUNTER — Encounter: Payer: Self-pay | Admitting: Family Medicine

## 2022-07-05 ENCOUNTER — Ambulatory Visit: Payer: Medicare Other | Admitting: Nurse Practitioner

## 2022-08-02 ENCOUNTER — Ambulatory Visit: Payer: Medicare Other | Admitting: Urology

## 2022-08-02 VITALS — BP 144/77 | HR 85 | Ht 72.0 in | Wt 233.0 lb

## 2022-08-02 DIAGNOSIS — Z2989 Encounter for other specified prophylactic measures: Secondary | ICD-10-CM

## 2022-08-02 DIAGNOSIS — R972 Elevated prostate specific antigen [PSA]: Secondary | ICD-10-CM

## 2022-08-02 DIAGNOSIS — C61 Malignant neoplasm of prostate: Secondary | ICD-10-CM

## 2022-08-02 MED ORDER — GENTAMICIN SULFATE 40 MG/ML IJ SOLN
80.0000 mg | Freq: Once | INTRAMUSCULAR | Status: AC
  Administered 2022-08-02: 80 mg via INTRAMUSCULAR

## 2022-08-02 MED ORDER — LEVOFLOXACIN 500 MG PO TABS
500.0000 mg | ORAL_TABLET | Freq: Once | ORAL | Status: AC
  Administered 2022-08-02: 500 mg via ORAL

## 2022-08-02 NOTE — Patient Instructions (Signed)

## 2022-08-02 NOTE — Progress Notes (Signed)
   08/02/22  Indication: Low risk prostate cancer diagnosed May 2023  MRI Fusion Prostate Biopsy Procedure   Informed consent was obtained, and we discussed the risks of bleeding and infection/sepsis. A time out was performed to ensure correct patient identity.  Pre-Procedure: - Last PSA Level: 5.0 - Gentamicin and levaquin given for antibiotic prophylaxis -Prostate measured 46 g on MRI, PSA density 0.11 - No significant hypoechoic or median lobe noted  Procedure: - Prostate block performed using 10 cc 1% lidocaine  - MRI fusion biopsy was performed, and 3 biopsies were taken from the ROI#1 PIRADS 4 lesion located left posterior lateral and posterior medial peripheral zone in the mid gland - Standard biopsies taken from sextant areas, 12 under ultrasound guidance. - Total of 15 cores taken  Post-Procedure: - Patient tolerated the procedure well - He was counseled to seek immediate medical attention if experiences significant bleeding, fevers, or severe pain - Return in one week to discuss biopsy results  Assessment/ Plan: Will follow up in 1-2 weeks to discuss pathology with Dr. Leitha Schuller, MD 08/02/2022

## 2022-08-02 NOTE — Progress Notes (Signed)
IM Injection  Patient is present today for an IM Injection for treatment of Gentamicin Drug: Gentamicin Dose:80 mg Location:[facility] Lot: [id number] Exp:04/24/2023 Patient tolerated well, no complications were noted  Performed by: Randa Lynn, RMA

## 2022-08-16 ENCOUNTER — Encounter: Payer: Self-pay | Admitting: Nurse Practitioner

## 2022-08-16 ENCOUNTER — Ambulatory Visit: Payer: Medicare Other | Admitting: Urology

## 2022-08-16 ENCOUNTER — Encounter: Payer: Self-pay | Admitting: Urology

## 2022-08-16 VITALS — BP 163/87 | HR 65 | Ht 72.0 in | Wt 235.0 lb

## 2022-08-16 DIAGNOSIS — C61 Malignant neoplasm of prostate: Secondary | ICD-10-CM | POA: Diagnosis not present

## 2022-08-16 NOTE — Progress Notes (Signed)
Marcelle Overlie Plume,acting as a scribe for Vanna Scotland, MD.,have documented all relevant documentation on the behalf of Vanna Scotland, MD,as directed by  Vanna Scotland, MD while in the presence of Vanna Scotland, MD.  08/16/2022 12:43 PM   Juan West March 17, 1950 [id number]  Referring provider: Larae Grooms, NP [address] [place],  [place] [address]  Chief Complaint  Patient presents with   Results    HPI:  72 year-old male who presents today for follow up of fusion biopsy results.   He has a personal history of low-risk prostate cancer and active surveillance, peyronie's disease, and hematuria. Notably, he is status post biopsy in 05/2021. HIS TRUSS volume is 56 cc. Surgical pathology revealed Gleason 3+3 in 2 of 12 cores, 20% of the left lateral base. His PSA remained relatively stable with some fluctuation.   He ended up having a prostate MRI as part of his surveillance protocol a year from diagnosis that showed a 45 gram prostate but a PI-RADS 4 lesion at the left posterior lateral aspect.   As such with a higher grade lesion, he did elect to undergo a fusion biopsy. This reveals a single core of Gleason  3+3 4% in the left lateral mid prostate. The region of interest PI-RADS 4 lesion was negative.   Today, he reports no dysuria or hematuria.      PMH: Past Medical History:  Diagnosis Date   Allergy    Cancer (HCC)    GERD (gastroesophageal reflux disease)     Surgical History: Past Surgical History:  Procedure Laterality Date   BASAL CELL CARCINOMA EXCISION     COLONOSCOPY WITH PROPOFOL N/A 02/13/2022   Procedure: COLONOSCOPY WITH PROPOFOL;  Surgeon: Toney Reil, MD;  Location: [facility] ENDOSCOPY;  Service: Gastroenterology;  Laterality: N/A;   FRACTURE SURGERY     HERNIA REPAIR     NO PAST SURGERIES     PTERYGIUM EXCISION     SQUAMOUS CELL CARCINOMA EXCISION      Home Medications:  Allergies as of 08/16/2022       Reactions   Codeine  Other (See Comments)   hallucinations   Melatonin Other (See Comments)   Drowsiness   Trazodone And Nefazodone Nausea And Vomiting        Medication List        Accurate as of August 16, 2022 12:43 PM. If you have any questions, ask your nurse or doctor.          cetirizine 10 MG tablet Commonly known as: ZYRTEC Take 1 tablet (10 mg total) by mouth daily.   hydrochlorothiazide 12.5 MG capsule Commonly known as: MICROZIDE Take 1 capsule (12.5 mg total) by mouth daily.   omeprazole 20 MG capsule Commonly known as: PRILOSEC Take 1 capsule (20 mg total) by mouth daily.        Allergies:  Allergies  Allergen Reactions   Codeine Other (See Comments)    hallucinations   Melatonin Other (See Comments)    Drowsiness    Trazodone And Nefazodone Nausea And Vomiting    Family History: Family History  Problem Relation Age of Onset   Stroke Mother    Cancer Father    Stroke Brother    Autism Granddaughter     Social History:  reports that he has never smoked. He has never used smokeless tobacco. He reports current alcohol use. He reports that he does not use drugs.   Physical Exam: BP (!) 163/87  Pulse 65   Ht 6' (1.829 m)   Wt 235 lb (106.6 kg)   BMI 31.87 kg/m   Constitutional:  Alert and oriented, No acute distress. HEENT: [place] AT, moist mucus membranes.  Trachea midline, no masses. Neurologic: Grossly intact, no focal deficits, moving all 4 extremities. Psychiatric: Normal mood and affect.   Assessment & Plan:    1. Prostate cancer - On active surveillance - His fusion biopsy was consistent with his previous low volume low-risk prostate cancer and serves as a confirmatory biopsy - Continue to firmly recommend active surveillance  - No immediate need for further biopsies or MRIs unless significant changes in PSA levels or symptoms occur. - PSA to be checked in six months.  Return in about 1 year (around 08/16/2023) for PSA and DRE.  PSA only in 6  mo  I have reviewed the above documentation for accuracy and completeness, and I agree with the above.   Vanna Scotland, MD   [facility] [address] [place], [place] [address] [phone]

## 2022-09-18 ENCOUNTER — Encounter: Payer: Self-pay | Admitting: Nurse Practitioner

## 2022-09-18 DIAGNOSIS — H60503 Unspecified acute noninfective otitis externa, bilateral: Secondary | ICD-10-CM

## 2022-09-19 NOTE — Telephone Encounter (Signed)
Appt scheduled

## 2022-09-21 ENCOUNTER — Encounter: Payer: Self-pay | Admitting: Physician Assistant

## 2022-09-21 ENCOUNTER — Other Ambulatory Visit: Payer: Self-pay | Admitting: Physician Assistant

## 2022-09-21 ENCOUNTER — Ambulatory Visit (INDEPENDENT_AMBULATORY_CARE_PROVIDER_SITE_OTHER): Payer: Medicare Other | Admitting: Physician Assistant

## 2022-09-21 VITALS — BP 158/79 | HR 80 | Temp 98.0°F | Wt 237.0 lb

## 2022-09-21 DIAGNOSIS — H60393 Other infective otitis externa, bilateral: Secondary | ICD-10-CM

## 2022-09-21 MED ORDER — CIPROFLOXACIN-DEXAMETHASONE 0.3-0.1 % OT SUSP: 4.0000 [drp] | Freq: Two times a day (BID) | OTIC | 0 refills | Status: DC

## 2022-09-21 NOTE — Progress Notes (Signed)
Acute Office Visit   Patient: Juan West   DOB: [birth year]   72 y.o. Male  MRN: [id number] Visit Date: 09/21/2022  Today's healthcare provider: Oswaldo Conroy Chancey Cullinane, PA-C  Introduced myself to the patient as a Secondary school teacher and provided education on APPs in clinical practice.    Chief Complaint  Patient presents with   Ear Pain    Pt states he cleaned his ears with peroxide and it helped but sounds muffled when he hears someone talk has used drops as well no relief    Sinus Problem   Subjective    HPI HPI     Ear Pain    Additional comments: Pt states he cleaned his ears with peroxide and it helped but sounds muffled when he hears someone talk has used drops as well no relief       Last edited by Andre Lefort, CMA on 09/21/2022  9:35 AM.        Ear fullness He states he went to the audiologist but they were not able to complete testing due to wax   Onset: gradual Duration: about 2 weeks  Associated symptoms: getting chunks of yellow wax and drainage, he reports ear soreness and fullness He reports decreased hearing  Interventions: has tried to clean ears with peroxide and has used ear drops    Medications: Outpatient Medications Prior to Visit  Medication Sig   cetirizine (ZYRTEC) 10 MG tablet Take 1 tablet (10 mg total) by mouth daily.   hydrochlorothiazide (MICROZIDE) 12.5 MG capsule Take 1 capsule (12.5 mg total) by mouth daily.   omeprazole (PRILOSEC) 20 MG capsule Take 1 capsule (20 mg total) by mouth daily.   No facility-administered medications prior to visit.    Review of Systems  HENT:  Positive for ear discharge and ear pain.         Objective    BP (!) 158/79   Pulse 80   Temp 98 F (36.7 C) (Oral)   Wt 237 lb (107.5 kg)   SpO2 98%   BMI 32.14 kg/m     Physical Exam Vitals reviewed.  Constitutional:      General: He is awake.     Appearance: Normal appearance. He is well-developed and well-groomed.  HENT:     Head:  Normocephalic and atraumatic.     Right Ear: No decreased hearing noted. Drainage and tenderness present. No laceration or swelling. A middle ear effusion is present. No foreign body. Tympanic membrane is erythematous and bulging. Tympanic membrane is not injected, scarred, perforated or retracted.     Left Ear: No decreased hearing noted. Drainage and tenderness present. No laceration or swelling. A middle ear effusion is present. No foreign body. Tympanic membrane is erythematous and bulging. Tympanic membrane is not injected, scarred, perforated or retracted.     Mouth/Throat:     Lips: Pink.     Mouth: Mucous membranes are moist.     Pharynx: Oropharynx is clear. Uvula midline. No pharyngeal swelling, oropharyngeal exudate, posterior oropharyngeal erythema, uvula swelling or postnasal drip.     Tonsils: No tonsillar exudate or tonsillar abscesses.  Eyes:     General: Lids are normal. Gaze aligned appropriately.     Extraocular Movements: Extraocular movements intact.     Conjunctiva/sclera: Conjunctivae normal.  Pulmonary:     Effort: Pulmonary effort is normal.  Musculoskeletal:     Cervical back: Normal range of motion.  Neurological:  Mental Status: He is alert.  Psychiatric:        Behavior: Behavior is cooperative.       No results found for any visits on 09/21/22.  Assessment & Plan      No follow-ups on file.       Problem List Items Addressed This Visit   None Visit Diagnoses     Other infective acute otitis externa of both ears    -  Primary Acute, new concern Patient presents with ongoing ear soreness and fullness as well as drainage for the past 2 weeks that is not resolving with home measures Physical exam is notable for otitis externa bilaterally. Initially I sent in Ciprodex otic solution to assist with this but this was not covered by patient's insurance Will send in acetic acid-hydrocortisone otic solution to be used 4 times per day for 7 days in each  ear Follow-up as needed for progressing or persistent symptoms        No follow-ups on file.   I, Cori Justus E Viveca Beckstrom, PA-C, have reviewed all documentation for this visit. The documentation on 09/27/22 for the exam, diagnosis, procedures, and orders are all accurate and complete.   Jacquelin Hawking, MHS, PA-C [facility]

## 2022-09-21 NOTE — Patient Instructions (Signed)
  I also recommend adding an antihistamine to your daily regimen  This includes medications like Claritin, Allegra, Zyrtec- the generics of these work very well and are usually less expensive  I recommend using Flonase nasal spray - 2 puffs twice per day to help with your nasal congestion The antihistamines and Flonase can take a few weeks to provide significant relief from allergy symptoms but should start to provide some benefit soon.  

## 2022-09-22 MED ORDER — HYDROCORTISONE-ACETIC ACID 1-2 % OT SOLN: 4.0000 [drp] | Freq: Four times a day (QID) | OTIC | 0 refills | Status: AC

## 2022-09-22 NOTE — Telephone Encounter (Signed)
Requested medication (s) are due for refill today - no  Requested medication (s) are on the active medication list -yes  Future visit scheduled -yes  Last refill: 09/21/22  Notes to clinic: Pharmacy request: Alternative requested  Requested Prescriptions  Pending Prescriptions Disp Refills   ciprofloxacin-dexamethasone (CIPRODEX) OTIC suspension [Pharmacy Med Name: CIPROFLOX-DEXAMETH OTIC SUSP] 7.5 mL 0    Sig: PLACE 4 DROPS INTO BOTH EARS 2 TIMES DAILY FOR 10 DAYS.     Off-Protocol Failed - 09/21/2022 10:41 AM      Failed - Medication not assigned to a protocol, review manually.      Passed - Valid encounter within last 12 months    Recent Outpatient Visits           Yesterday Other infective acute otitis externa of both ears   [place] [facility] Mecum, Oswaldo Conroy, PA-C   3 months ago Primary hypertension   [place] [facility] Larae Grooms, NP   4 months ago Primary hypertension   [facility] Larae Grooms, NP   5 months ago Primary hypertension   [place] [facility] Larae Grooms, NP   7 months ago Encounter for annual wellness exam in Medicare patient   [facility] Larae Grooms, NP       Future Appointments             In 2 months Larae Grooms, NP [place] [facility]   In 8 months Vanna Scotland, MD [facility] [place]   In 10 months Vanna Scotland, MD [facility] [place]               Requested Prescriptions  Pending Prescriptions Disp Refills   ciprofloxacin-dexamethasone (CIPRODEX) OTIC suspension [Pharmacy Med Name: [facility] OTIC SUSP] 7.5 mL 0    Sig: PLACE 4 DROPS INTO BOTH EARS 2 TIMES DAILY FOR 10 DAYS.     Off-Protocol Failed - 09/21/2022 10:41 AM      Failed - Medication not assigned to a protocol, review manually.      Passed - Valid encounter within last 12 months     Recent Outpatient Visits           Yesterday Other infective acute otitis externa of both ears   [place] [facility] Mecum, Oswaldo Conroy, PA-C   3 months ago Primary hypertension   [place] [facility] Larae Grooms, NP   4 months ago Primary hypertension   [facility] Larae Grooms, NP   5 months ago Primary hypertension   [place] [facility] Larae Grooms, NP   7 months ago Encounter for annual wellness exam in Medicare patient   [place] [facility] Larae Grooms, NP       Future Appointments             In 2 months Larae Grooms, NP  [facility]   In 8 months Vanna Scotland, MD [facility] [place]   In 10 months Vanna Scotland, MD [facility]

## 2022-10-28 IMAGING — CT CT HEART MORP W/ CTA COR W/ SCORE W/ CA W/CM &/OR W/O CM
4 of 7 series · 8 of 20 positions shown, 9 images · IV contrast (omnipaque)
Comparison: None.
COMPARISON: None.

Addendum:
EXAM:
OVER-READ INTERPRETATION  CT CHEST

The following report is an over-read performed by radiologist Dr.
Iain Cedeno [REDACTED] on 11/17/2020. This
over-read does not include interpretation of cardiac or coronary
anatomy or pathology. The coronary CTA interpretation by the
cardiologist is attached.
HISTORY: Chest pain, nonspecific Dyspnea on exertion (DAYAMI)
Cardiac/Coronary CT
TECHNIQUE: The patient was scanned on a Siemens Force scanner.
PROTOCOL: A 110 kV prospective scan was triggered in the descending thoracic
aorta at 111 HU's. Axial non-contrast 3 mm slices were carried out
through the heart. The data set was analyzed on a dedicated work
station and scored using the Agatson method. Gantry rotation speed
was 250 msecs and collimation was 0.6 mm. Heart rate optimized
medically, and 0.8 mg of sublingual nitroglycerin was given. The 3D
data set was reconstructed in 5% intervals of 35-75% of the R-R
cycle. Diastolic phases were analyzed on a dedicated work station
using MPR, MIP and VRT modes. The patient received 100mL OMNIPAQUE
IOHEXOL 350 MG/ML SOLN of contrast.

[Series 7: best syst · axial · 0.41mm/px · z∈[-274,-228]mm · 2 of 349 slices shown, 3 images]
[im 117/349  vessel]
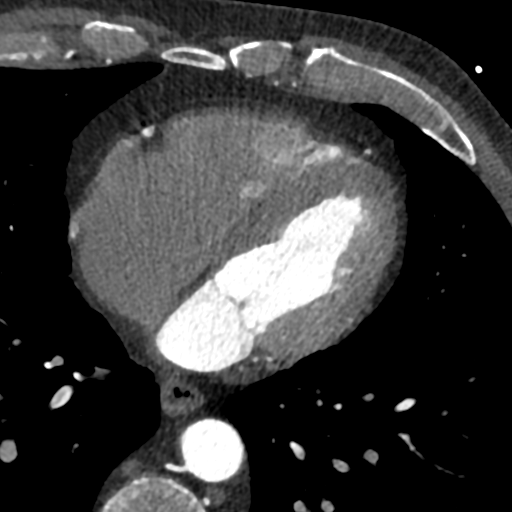
[im 117/349  lung]
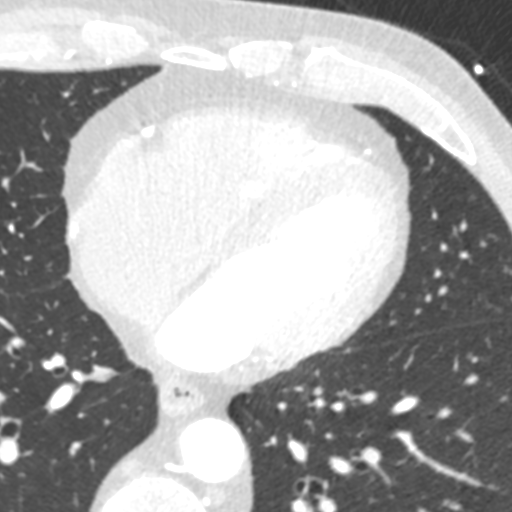
[im 233/349  vessel]
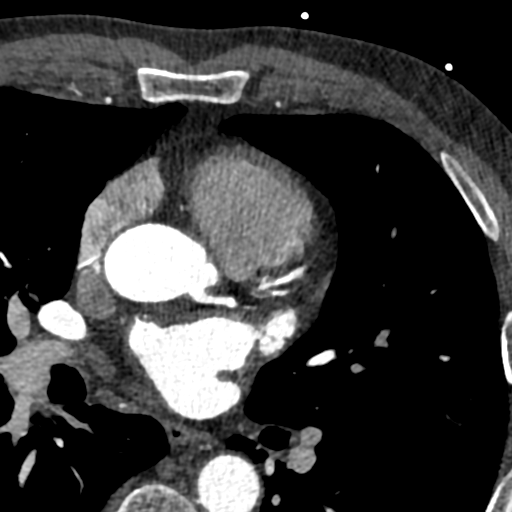

[Series 8: best diast 70 % · axial · 0.41mm/px · z∈[-274,-228]mm · 2 of 349 slices shown]
[im 117/349  vessel]
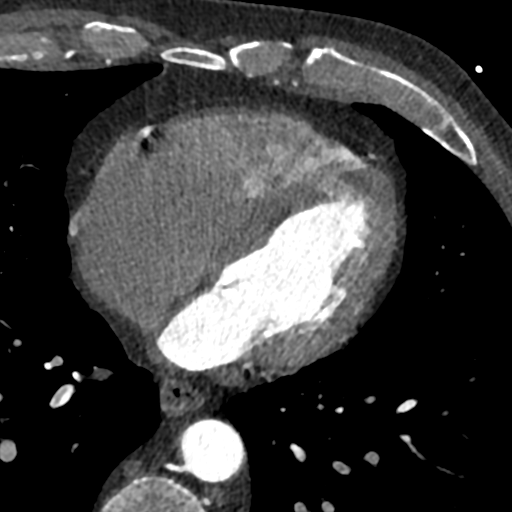
[im 233/349  vessel]
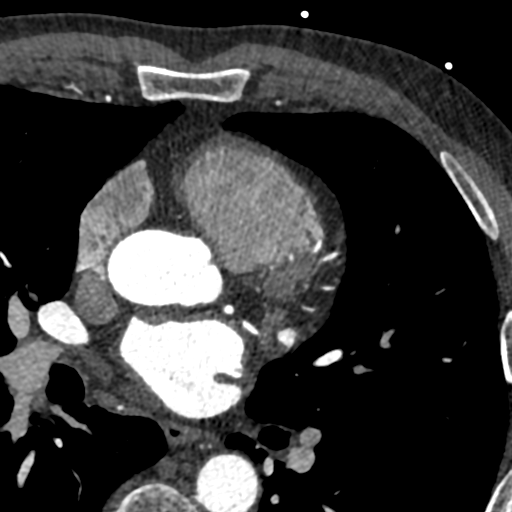

[Series 9: ts diast 68 % · axial · 0.41mm/px · z∈[-274,-228]mm · 2 of 349 slices shown]
[im 117/349  vessel]
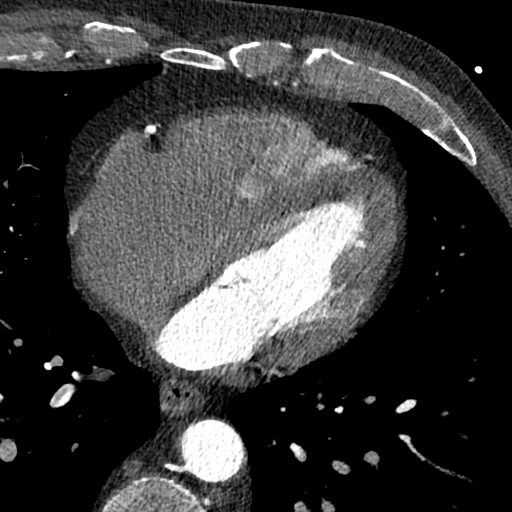
[im 233/349  vessel]
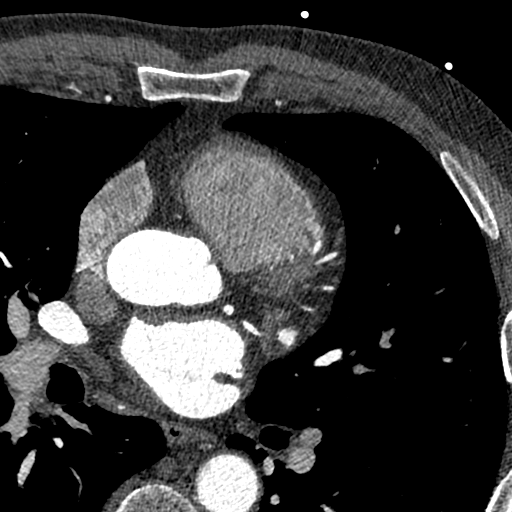

[Series 10: ts syst 50 % · axial · 0.41mm/px · z∈[-274,-228]mm · 2 of 349 slices shown]
[im 117/349  vessel]
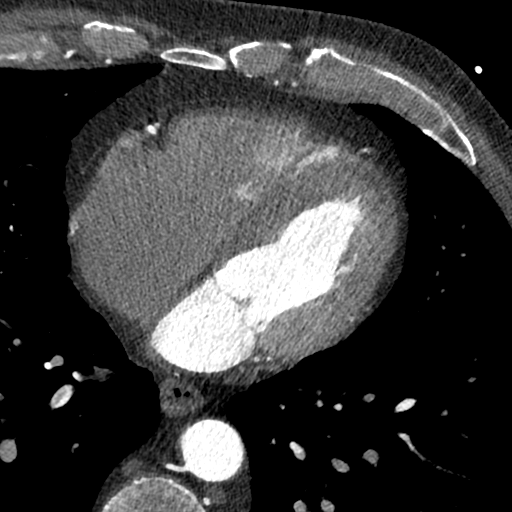
[im 233/349  vessel]
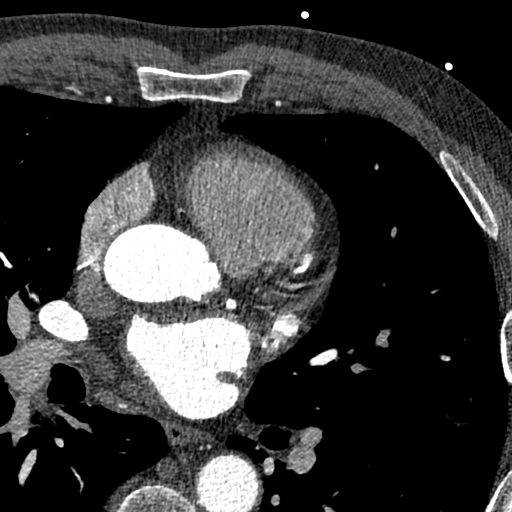

[8 of 20 positions shown; findings below may reference images not displayed]

FINDINGS: Vascular: Heart is normal size.  Aorta normal caliber.

Mediastinum/Nodes: No adenopathy

Lungs/Pleura: No confluent opacities or effusions.

Upper Abdomen: Low-density lesion seen in the visualized hepatic
dome, likely cysts.

Musculoskeletal: Chest wall soft tissues are unremarkable. No acute
bony abnormality.
IMPRESSION: No acute extra cardiac abnormality.

Hepatic cysts in the dome of the liver.
FINDINGS: Coronary calcium score: The patient's coronary artery calcium score
is 0, which places the patient in the 0 percentile.

Coronary arteries: Normal coronary origins.  Right dominance.

Right Coronary Artery: Normal caliber vessel, gives rise to PDA. No
significant plaque or stenosis.

Left Main Coronary Artery: Normal caliber vessel. No significant
plaque or stenosis.

Left Anterior Descending Coronary Artery: Normal caliber vessel. No
significant plaque or stenosis. Gives rise to 2 diagonal branches.

Left Circumflex Artery: Normal caliber vessel. No significant plaque
or stenosis. Gives rise to OM branches.

Aorta: Normal size, 31 mm at the mid ascending aorta (level of the
PA bifurcation) measured double oblique. No calcifications. No
dissection seen in visualized portions of the aorta.

Aortic Valve: No calcifications. Trileaflet.

Other findings:

Normal pulmonary vein drainage into the left atrium.

Normal left atrial appendage without a thrombus.

Normal size of the pulmonary artery.

Normal appearance of the pericardium.

There is step artifact, but vessels are able to be interpreted.
IMPRESSION: 1. No evidence of CAD, CADRADS = 0.

2. Coronary calcium score of 0. This was 0 percentile for age and
sex matched control.

3. Normal coronary origin with right dominance.

INTERPRETATION:

1. CAD-RADS 0: No evidence of CAD (0%). Consider non-atherosclerotic
causes of chest pain.

2. CAD-RADS 1: Minimal non-obstructive CAD (0-24%). Consider
non-atherosclerotic causes of chest pain. Consider preventive
therapy and risk factor modification.

3. CAD-RADS 2: Mild non-obstructive CAD (25-49%). Consider
non-atherosclerotic causes of chest pain. Consider preventive
therapy and risk factor modification.

4. CAD-RADS 3: Moderate stenosis (50-69%). Consider symptom-guided
anti-ischemic pharmacotherapy as well as risk factor modification
per guideline directed care. Additional analysis with CT FFR will be
submitted.

5. CAD-RADS 4: Severe stenosis. (70-99% or > 50% left main). Cardiac
catheterization or CT FFR is recommended. Consider symptom-guided
anti-ischemic pharmacotherapy as well as risk factor modification
per guideline directed care. Invasive coronary angiography
recommended with revascularization per published guideline
statements.

6. CAD-RADS 5: Total coronary occlusion (100%). Consider cardiac
catheterization or viability assessment. Consider symptom-guided
anti-ischemic pharmacotherapy as well as risk factor modification
per guideline directed care.

7. CAD-RADS N: Non-diagnostic study. Obstructive CAD can't be
excluded. Alternative evaluation is recommended.

*** End of Addendum ***
EXAM:
OVER-READ INTERPRETATION  CT CHEST

The following report is an over-read performed by radiologist Dr.
Iain Cedeno [REDACTED] on 11/17/2020. This
over-read does not include interpretation of cardiac or coronary
anatomy or pathology. The coronary CTA interpretation by the
cardiologist is attached.
FINDINGS: Vascular: Heart is normal size.  Aorta normal caliber.

Mediastinum/Nodes: No adenopathy

Lungs/Pleura: No confluent opacities or effusions.

Upper Abdomen: Low-density lesion seen in the visualized hepatic
dome, likely cysts.

Musculoskeletal: Chest wall soft tissues are unremarkable. No acute
bony abnormality.
IMPRESSION: No acute extra cardiac abnormality.

Hepatic cysts in the dome of the liver.

## 2022-11-13 ENCOUNTER — Encounter: Payer: Self-pay | Admitting: Nurse Practitioner

## 2022-11-13 NOTE — Telephone Encounter (Signed)
Appointment scheduled.

## 2022-11-14 ENCOUNTER — Encounter: Payer: Self-pay | Admitting: Nurse Practitioner

## 2022-11-15 MED ORDER — HYDROCHLOROTHIAZIDE 12.5 MG PO CAPS: 12.5000 mg | ORAL_CAPSULE | Freq: Every day | ORAL | 1 refills | Status: DC

## 2022-11-19 DIAGNOSIS — L728 Other follicular cysts of the skin and subcutaneous tissue: Secondary | ICD-10-CM | POA: Diagnosis not present

## 2022-11-19 DIAGNOSIS — D2239 Melanocytic nevi of other parts of face: Secondary | ICD-10-CM | POA: Diagnosis not present

## 2022-11-19 DIAGNOSIS — D0439 Carcinoma in situ of skin of other parts of face: Secondary | ICD-10-CM | POA: Diagnosis not present

## 2022-11-19 DIAGNOSIS — L720 Epidermal cyst: Secondary | ICD-10-CM | POA: Diagnosis not present

## 2022-11-20 DIAGNOSIS — D2262 Melanocytic nevi of left upper limb, including shoulder: Secondary | ICD-10-CM | POA: Diagnosis not present

## 2022-11-20 DIAGNOSIS — D2272 Melanocytic nevi of left lower limb, including hip: Secondary | ICD-10-CM | POA: Diagnosis not present

## 2022-11-20 DIAGNOSIS — D225 Melanocytic nevi of trunk: Secondary | ICD-10-CM | POA: Diagnosis not present

## 2022-11-20 DIAGNOSIS — X32XXXA Exposure to sunlight, initial encounter: Secondary | ICD-10-CM | POA: Diagnosis not present

## 2022-11-20 DIAGNOSIS — L570 Actinic keratosis: Secondary | ICD-10-CM | POA: Diagnosis not present

## 2022-11-20 DIAGNOSIS — D2261 Melanocytic nevi of right upper limb, including shoulder: Secondary | ICD-10-CM | POA: Diagnosis not present

## 2022-11-20 DIAGNOSIS — D485 Neoplasm of uncertain behavior of skin: Secondary | ICD-10-CM | POA: Diagnosis not present

## 2022-12-12 ENCOUNTER — Other Ambulatory Visit: Payer: Medicare Other

## 2022-12-12 DIAGNOSIS — C61 Malignant neoplasm of prostate: Secondary | ICD-10-CM | POA: Diagnosis not present

## 2022-12-13 LAB — PSA: Prostate Specific Ag, Serum: 5.2 ng/mL — ABNORMAL HIGH (ref 0.0–4.0)

## 2022-12-17 ENCOUNTER — Encounter: Payer: Self-pay | Admitting: Urology

## 2022-12-18 ENCOUNTER — Ambulatory Visit (INDEPENDENT_AMBULATORY_CARE_PROVIDER_SITE_OTHER): Payer: Medicare Other | Admitting: Nurse Practitioner

## 2022-12-18 ENCOUNTER — Encounter: Payer: Self-pay | Admitting: Nurse Practitioner

## 2022-12-18 VITALS — BP 139/88 | HR 86 | Temp 98.5°F | Ht 72.0 in | Wt 238.8 lb

## 2022-12-18 DIAGNOSIS — I10 Essential (primary) hypertension: Secondary | ICD-10-CM | POA: Diagnosis not present

## 2022-12-18 DIAGNOSIS — Z23 Encounter for immunization: Secondary | ICD-10-CM | POA: Diagnosis not present

## 2022-12-18 MED ORDER — OMEPRAZOLE 20 MG PO CPDR: 20.0000 mg | DELAYED_RELEASE_CAPSULE | Freq: Every day | ORAL | 1 refills | Status: DC

## 2022-12-18 NOTE — Progress Notes (Signed)
BP 139/88 (BP Location: Left Arm, Patient Position: Sitting, Cuff Size: Normal)   Pulse 86   Temp 98.5 F (36.9 C) (Oral)   Ht 6' (1.829 m)   Wt 238 lb 12.8 oz (108.3 kg)   SpO2 97%   BMI 32.39 kg/m    Subjective:    Patient ID: Juan West, male    DOB: 10-12-1950, 72 y.o.   MRN: [id number]  HPI: Juan West is a 72 y.o. male  Chief Complaint  Patient presents with   6 month follow up    No concerns today    Medication Refill    Omeprazole   HYPERTENSION without Chronic Kidney Disease Hypertension status: controlled Satisfied with current treatment? yes Duration of hypertension: years BP monitoring frequency: not checking BP range:  BP medication side effects:  no Medication compliance: excellent compliance Previous BP meds:HCTZ Aspirin: no Recurrent headaches: no Visual changes: no Palpitations: no Dyspnea: yes Chest pain: no Lower extremity edema: no Dizzy/lightheaded: no  Relevant past medical, surgical, family and social history reviewed and updated as indicated. Interim medical history since our last visit reviewed. Allergies and medications reviewed and updated.  Review of Systems  Eyes:  Negative for visual disturbance.  Respiratory:  Positive for shortness of breath.   Cardiovascular:  Negative for chest pain and leg swelling.  Neurological:  Negative for light-headedness and headaches.    Per HPI unless specifically indicated above     Objective:    BP 139/88 (BP Location: Left Arm, Patient Position: Sitting, Cuff Size: Normal)   Pulse 86   Temp 98.5 F (36.9 C) (Oral)   Ht 6' (1.829 m)   Wt 238 lb 12.8 oz (108.3 kg)   SpO2 97%   BMI 32.39 kg/m   Wt Readings from Last 3 Encounters:  12/18/22 238 lb 12.8 oz (108.3 kg)  09/21/22 237 lb (107.5 kg)  08/16/22 235 lb (106.6 kg)    Physical Exam Vitals and nursing note reviewed.  Constitutional:      General: He is not in acute distress.    Appearance: Normal appearance. He  is not ill-appearing, toxic-appearing or diaphoretic.  HENT:     Head: Normocephalic.     Right Ear: External ear normal.     Left Ear: External ear normal.     Nose: Nose normal. No congestion or rhinorrhea.     Mouth/Throat:     Mouth: Mucous membranes are moist.  Eyes:     General:        Right eye: No discharge.        Left eye: No discharge.     Extraocular Movements: Extraocular movements intact.     Conjunctiva/sclera: Conjunctivae normal.     Pupils: Pupils are equal, round, and reactive to light.  Cardiovascular:     Rate and Rhythm: Normal rate and regular rhythm.     Heart sounds: No murmur heard. Pulmonary:     Effort: Pulmonary effort is normal. No respiratory distress.     Breath sounds: Normal breath sounds. No wheezing, rhonchi or rales.  Abdominal:     General: Abdomen is flat. Bowel sounds are normal.  Musculoskeletal:     Cervical back: Normal range of motion and neck supple.  Skin:    General: Skin is warm and dry.     Capillary Refill: Capillary refill takes less than 2 seconds.  Neurological:     General: No focal deficit present.     Mental Status: He is  alert and oriented to person, place, and time.  Psychiatric:        Mood and Affect: Mood normal.        Behavior: Behavior normal.        Thought Content: Thought content normal.        Judgment: Judgment normal.     Results for orders placed or performed in visit on 12/12/22  PSA  Result Value Ref Range   Prostate Specific Ag, Serum 5.2 (H) 0.0 - 4.0 ng/mL      Assessment & Plan:   Problem List Items Addressed This Visit       Cardiovascular and Mediastinum   Primary hypertension - Primary    Chronic.  Controlled.  Continue with current medication regimen of hydrochlorothiazide 12.5mg  daily.  Refills have already been sent.  Recommend checking blood pressures at home and bringing log to next visit.  Labs ordered today.  Return to clinic in 6 months for reevaluation.  Call sooner if concerns  arise.         Relevant Orders   Comp Met (CMET)   Other Visit Diagnoses     Need for influenza vaccination       Relevant Orders   Flu Vaccine Trivalent High Dose (Fluad)        Follow up plan: No follow-ups on file.

## 2022-12-18 NOTE — Assessment & Plan Note (Signed)
Chronic.  Controlled.  Continue with current medication regimen of hydrochlorothiazide 12.5mg  daily.  Refills have already been sent.  Recommend checking blood pressures at home and bringing log to next visit.  Labs ordered today.  Return to clinic in 6 months for reevaluation.  Call sooner if concerns arise.

## 2022-12-19 LAB — COMPREHENSIVE METABOLIC PANEL WITH GFR
ALT: 26 IU/L (ref 0–44)
AST: 28 IU/L (ref 0–40)
Albumin: 4.7 g/dL (ref 3.8–4.8)
Alkaline Phosphatase: 71 IU/L (ref 44–121)
BUN/Creatinine Ratio: 13 (ref 10–24)
BUN: 16 mg/dL (ref 8–27)
Bilirubin Total: 0.6 mg/dL (ref 0.0–1.2)
CO2: 21 mmol/L (ref 20–29)
Calcium: 9.4 mg/dL (ref 8.6–10.2)
Chloride: 102 mmol/L (ref 96–106)
Creatinine, Ser: 1.21 mg/dL (ref 0.76–1.27)
Globulin, Total: 2.7 g/dL (ref 1.5–4.5)
Glucose: 101 mg/dL — ABNORMAL HIGH (ref 70–99)
Potassium: 4.4 mmol/L (ref 3.5–5.2)
Sodium: 139 mmol/L (ref 134–144)
Total Protein: 7.4 g/dL (ref 6.0–8.5)
eGFR: 63 mL/min/1.73 (ref >59)

## 2023-02-08 ENCOUNTER — Other Ambulatory Visit: Payer: Medicare Other

## 2023-02-08 DIAGNOSIS — C61 Malignant neoplasm of prostate: Secondary | ICD-10-CM | POA: Diagnosis not present

## 2023-02-09 LAB — PSA: Prostate Specific Ag, Serum: 5.2 ng/mL — ABNORMAL HIGH (ref 0.0–4.0)

## 2023-02-14 ENCOUNTER — Encounter: Payer: Self-pay | Admitting: Urology

## 2023-03-19 ENCOUNTER — Encounter: Payer: Self-pay | Admitting: Nurse Practitioner

## 2023-03-20 MED ORDER — OMEPRAZOLE 20 MG PO CPDR: 20.0000 mg | DELAYED_RELEASE_CAPSULE | Freq: Every day | ORAL | 1 refills | Status: DC

## 2023-03-21 ENCOUNTER — Encounter: Payer: Self-pay | Admitting: Nurse Practitioner

## 2023-03-22 ENCOUNTER — Encounter: Payer: Self-pay | Admitting: Pediatrics

## 2023-03-22 ENCOUNTER — Ambulatory Visit (INDEPENDENT_AMBULATORY_CARE_PROVIDER_SITE_OTHER): Payer: Medicare Other | Admitting: Pediatrics

## 2023-03-22 VITALS — BP 137/78 | HR 63 | Temp 98.7°F | Ht 72.0 in | Wt 242.6 lb

## 2023-03-22 DIAGNOSIS — H60503 Unspecified acute noninfective otitis externa, bilateral: Secondary | ICD-10-CM | POA: Diagnosis not present

## 2023-03-22 MED ORDER — HYDROCORTISONE-ACETIC ACID 1-2 % OT SOLN: 4.0000 [drp] | OTIC | 0 refills | Status: AC

## 2023-03-22 NOTE — Progress Notes (Signed)
 Office Visit  BP 137/78   Pulse 63   Temp 98.7 F (37.1 C) (Oral)   Ht 6' (1.829 m)   Wt 242 lb 9.6 oz (110 kg)   SpO2 97%   BMI 32.90 kg/m    Subjective:    Patient ID: Juan West, male    DOB: [birth year], 73 y.o.   MRN: [id number]  HPI: Juan West is a 73 y.o. male  Chief Complaint  Patient presents with   Ear Pain    Patient states he has been having issues with bilateral ear pain for a while now, states it has gotten worse recently. States he has noticed fluid coming from both of them, but mainly the left. States they both itch as well, rating pain at a 4 right now at today's visit.     Discussed the use of AI scribe software for clinical note transcription with the patient, who gave verbal consent to proceed.  History of Present Illness   Juan West is a 73 year old male who presents with ear canal discomfort and drainage.  He experiences ear canal discomfort characterized by amber liquid drainage, itching, and dryness. The ears are described as 'very sore', and the discomfort prevents him from wearing hearing aids.  He has a history of similar symptoms occurring years ago, which were evaluated by a doctor in [place] and a healthcare provider in [place], but no definitive diagnosis was provided. The symptoms subside for a while and then return with increased severity. He has tried hydrocortisone, which provided some relief, and was previously given drops by a PA in August, which helped to some extent. He also reports a ring of dried blood around the tympanic membrane, which he associates with the amber discharge.  No other symptoms such as congestion or cough. There are no sick contacts or children with ear infections around him. He has a long-standing history of sinus conditions.        Relevant past medical, surgical, family and social history reviewed and updated as indicated. Interim medical history since our last visit reviewed. Allergies  and medications reviewed and updated.  ROS per HPI unless specifically indicated above     Objective:    BP 137/78   Pulse 63   Temp 98.7 F (37.1 C) (Oral)   Ht 6' (1.829 m)   Wt 242 lb 9.6 oz (110 kg)   SpO2 97%   BMI 32.90 kg/m   Wt Readings from Last 3 Encounters:  03/22/23 242 lb 9.6 oz (110 kg)  12/18/22 238 lb 12.8 oz (108.3 kg)  09/21/22 237 lb (107.5 kg)     Physical Exam Constitutional:      Appearance: Normal appearance.  HENT:     Right Ear: Tympanic membrane normal. No tenderness. No middle ear effusion. Tympanic membrane is not perforated, erythematous, retracted or bulging.     Left Ear: Tympanic membrane normal. No tenderness.  No middle ear effusion. Tympanic membrane is not perforated, erythematous, retracted or bulging.     Ears:     Comments: R TM with dark/amber colored rim His bilateral canals mildly erythematous Pulmonary:     Effort: Pulmonary effort is normal.  Musculoskeletal:        General: Normal range of motion.  Skin:    Comments: Normal skin color  Neurological:     General: No focal deficit present.     Mental Status: He is alert. Mental status is at baseline.  Psychiatric:  Mood and Affect: Mood normal.        Behavior: Behavior normal.        Thought Content: Thought content normal.        Assessment & Plan:  Assessment & Plan   Acute otitis externa of both ears, unspecified type Reviewed history and recent notes for similar issue. Patient reports intermittent ear canal discomfort, itching, and amber liquid drainage. No associated upper respiratory symptoms. Previous treatment with hydrocortisone and antibiotic drops provided some relief. Examination revealed inflammation and dried blood in the ear canal, suggestive of possible eczema vs recurrent otitis externa. Wears hearing aids. Open to ENT referral for second opinion given no clear cause.  -Start antibiotic drops (3-4 drops every 6 hours for 7 days). -Refer to ENT for  further evaluation and management. -If symptoms persist, consider adding steroid drops next week. Patient to communicate via MyChart for follow-up.  -     Hydrocortisone-Acetic Acid; Place 4 drops into both ears every 4 (four) hours for 7 days.  Dispense: 8.4 mL; Refill: 0 -     Ambulatory referral to ENT  Encounter for behavioral health screening Refused.  Follow up plan: Return if symptoms worsen or fail to improve.  Jackolyn Confer, MD  Approximately 30 minutes spent on patient encounter today including assessment, counseling, diagnosing, treatment plan development, and charting.

## 2023-03-22 NOTE — Patient Instructions (Addendum)
 Your symptoms may be due to ear canal eczema or an outer ear infection. It is hard to know exactly but the drops I sent will help with inflammation and irritation. If there is an infection, it will help with that too. If your symptoms aren't better next week, please message me and we can try the steroid drops.  RX: Vosol drops - can use every 4 hours for 7 days.  I placed an ENT consult for a second opinion.

## 2023-03-26 MED ORDER — OMEPRAZOLE 20 MG PO CPDR: 20.0000 mg | DELAYED_RELEASE_CAPSULE | Freq: Every day | ORAL | 1 refills | Status: DC

## 2023-03-28 DIAGNOSIS — H93293 Other abnormal auditory perceptions, bilateral: Secondary | ICD-10-CM | POA: Diagnosis not present

## 2023-03-28 DIAGNOSIS — H6063 Unspecified chronic otitis externa, bilateral: Secondary | ICD-10-CM | POA: Diagnosis not present

## 2023-05-13 IMAGING — CT CT ABD-PEL WO/W CM
3 of 12 series · 11 of 46 positions shown, 17 images · IV contrast (agent unspecified)
Comparison: None Available.

CLINICAL DATA: Hematuria.

EXAM:
CT ABDOMEN AND PELVIS WITHOUT AND WITH CONTRAST
TECHNIQUE: Multidetector CT imaging of the abdomen and pelvis was performed
following the standard protocol before and following the bolus
administration of intravenous contrast.

[Series 4: axial post · axial · 0.91mm/px · z∈[-552,-142]mm · 6 of 116 slices shown, 11 images]
[im 17/116  soft-tissue]
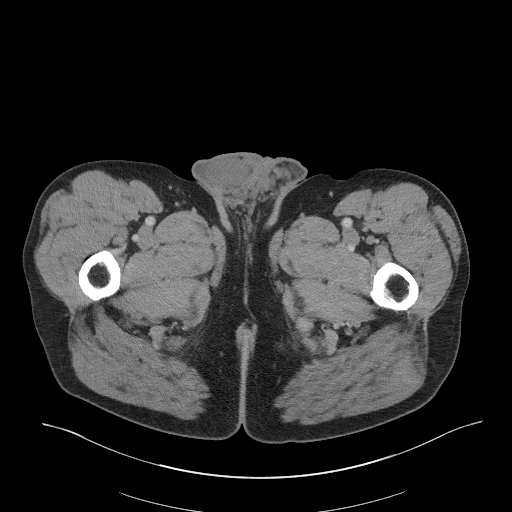
[im 17/116  bone]
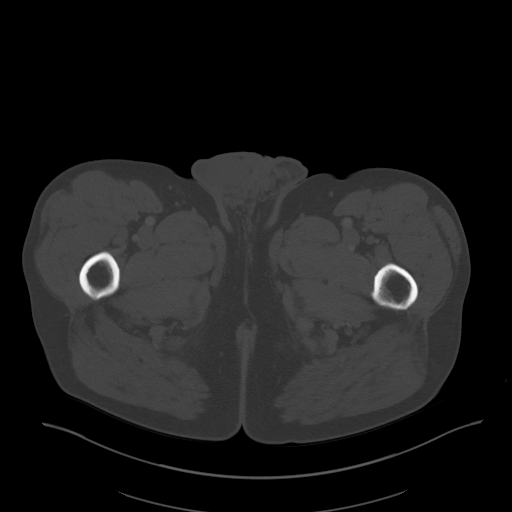
[im 33/116  soft-tissue]
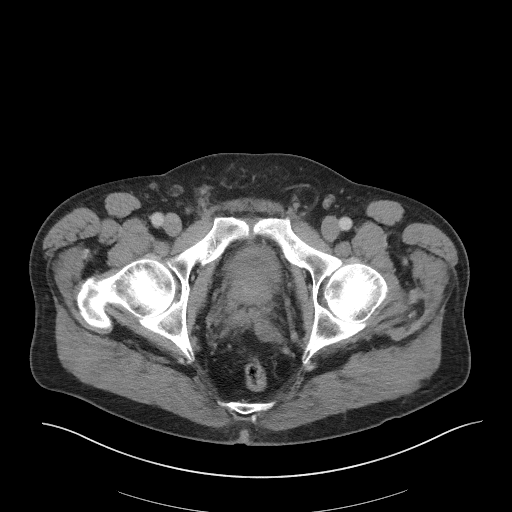
[im 50/116  soft-tissue]
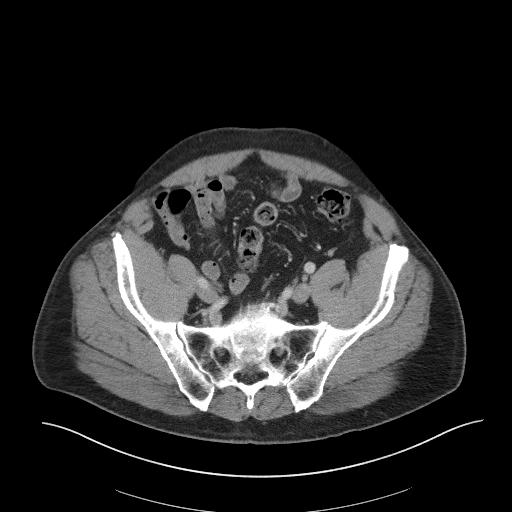
[im 50/116  lung]
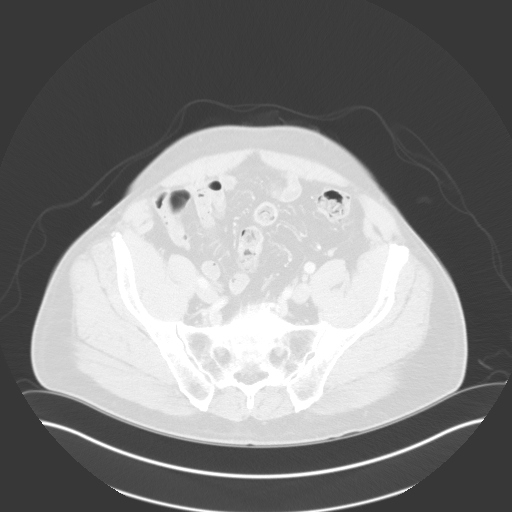
[im 66/116  soft-tissue]
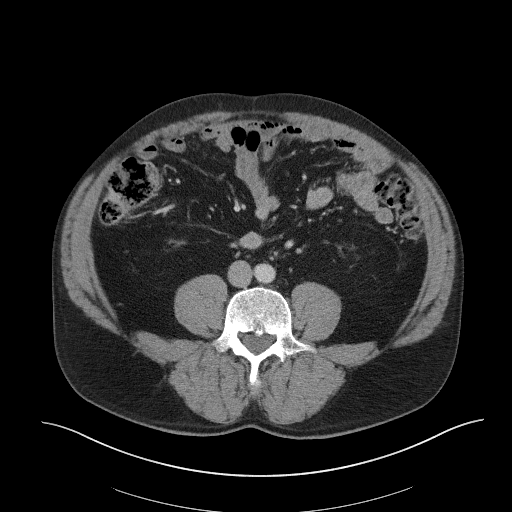
[im 66/116  lung]
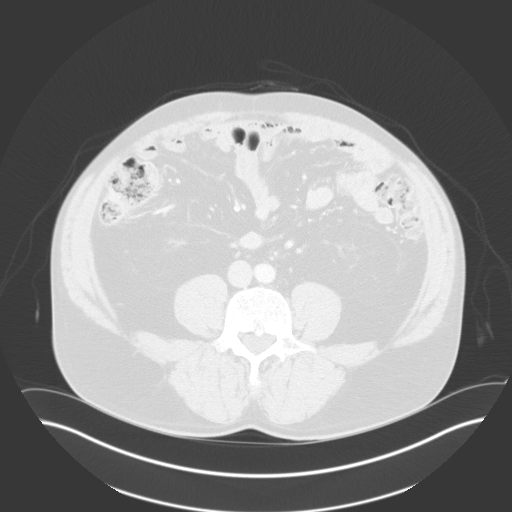
[im 83/116  soft-tissue]
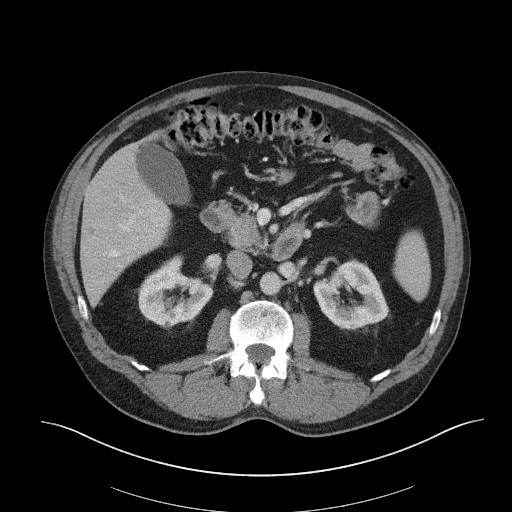
[im 83/116  lung]
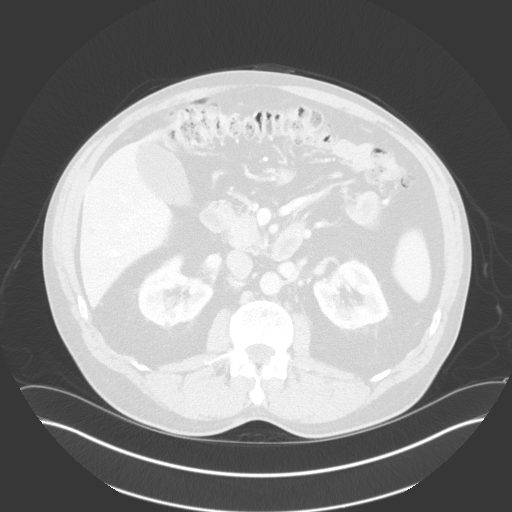
[im 99/116  soft-tissue]
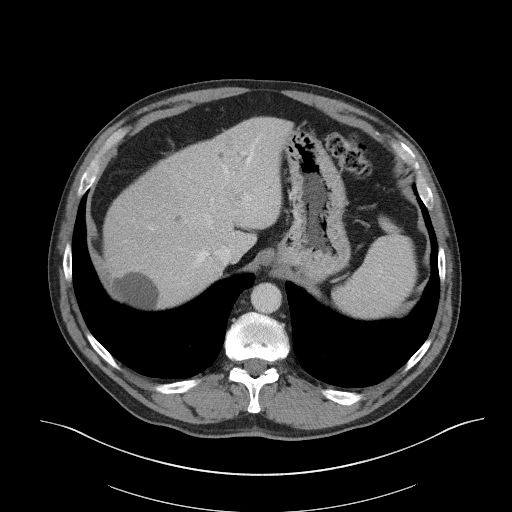
[im 99/116  lung]
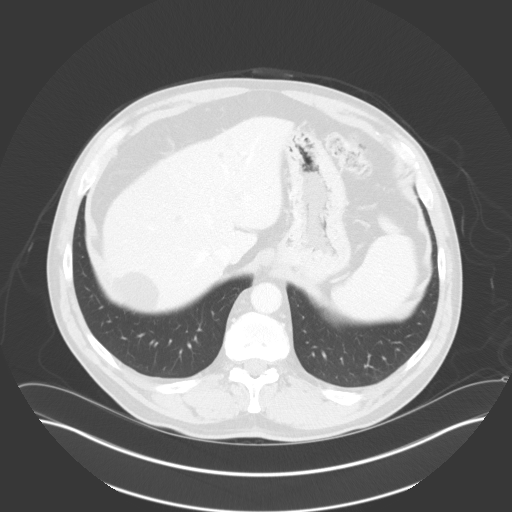

[Series 7: coronal pre · coronal · non-contrast · 0.92mm/px · 2 of 114 slices shown, 3 images]
[im 38/114  soft-tissue]
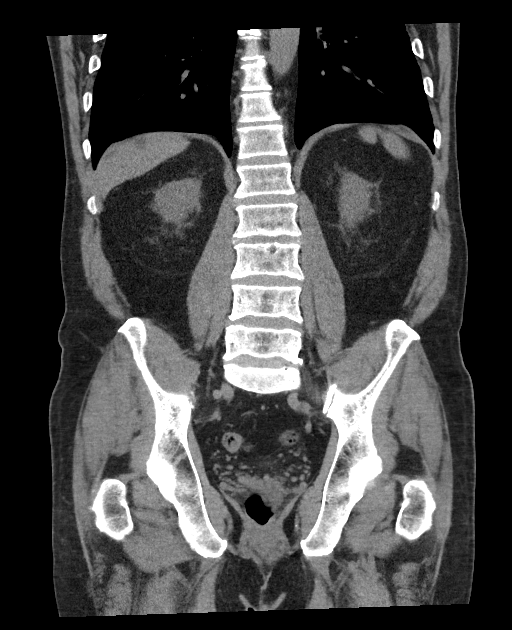
[im 38/114  bone]
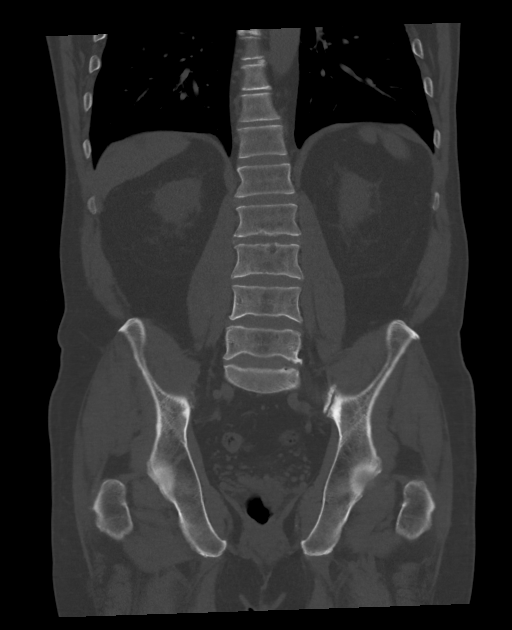
[im 76/114  soft-tissue]
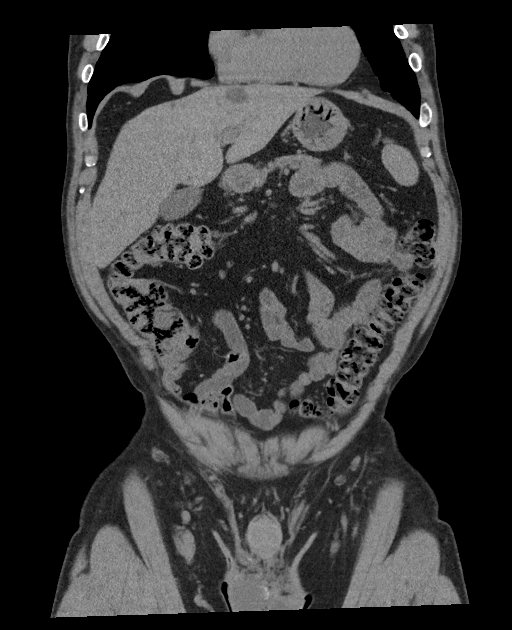

[Series 13: axial delay · axial · delayed · 0.92mm/px · z∈[-635,-475]mm · 3 of 112 slices shown]
[im 16/112  soft-tissue]
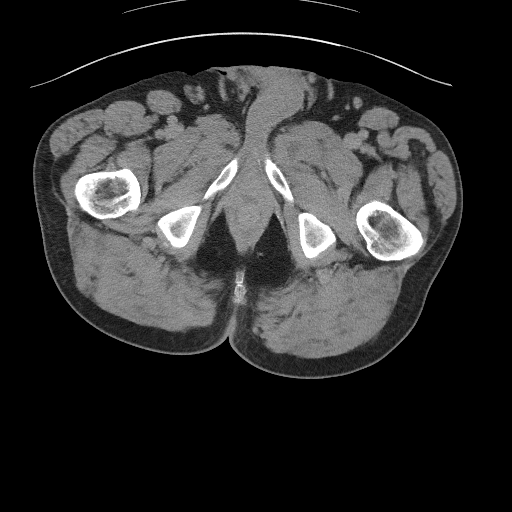
[im 32/112  soft-tissue]
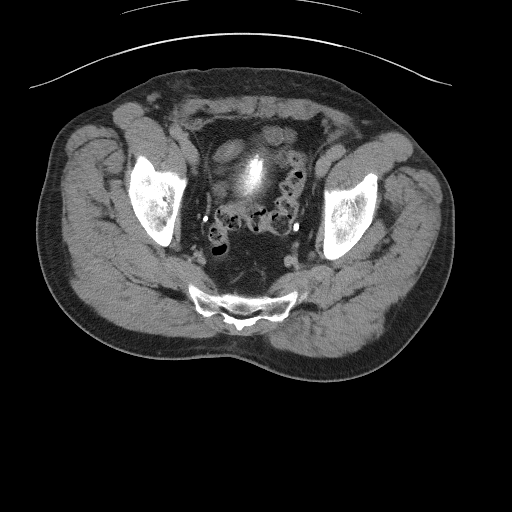
[im 48/112  soft-tissue]
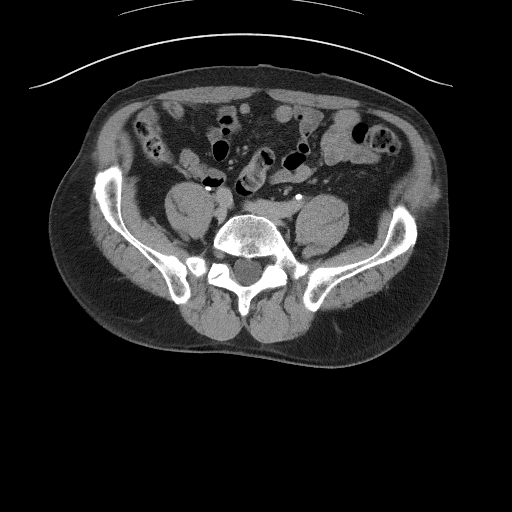

[11 of 46 positions shown; findings below may reference images not displayed]

RADIATION DOSE REDUCTION: This exam was performed according to the
departmental dose-optimization program which includes automated
exposure control, adjustment of the mA and/or kV according to
patient size and/or use of iterative reconstruction technique.

CONTRAST:  100mL OMNIPAQUE IOHEXOL 300 MG/ML  SOLN
FINDINGS: Lower chest: No acute abnormality.

Hepatobiliary: Scattered liver cysts. The largest 4.2 cm cyst is
noted within posterior dome of right hepatic lobe, image [DATE].
There also multiple scattered too small to characterize low-density
liver lesions which statistically also likely represent small cysts.
Gallbladder appears normal. No bile duct dilatation.

Pancreas: Unremarkable. No pancreatic ductal dilatation or
surrounding inflammatory changes.

Spleen: Normal in size without focal abnormality.

Adrenals/Urinary Tract: Normal appearance of the adrenal glands. No
suspicious kidney lesions identified. Bosniak class 1 cyst arises
off the upper pole of the right kidney measuring 1 point 8 cm, image
[DATE]. Bosniak class 1 cyst arises off the medial cortex of the
interpolar left kidney measuring 1.5 cm, image 38/4. No follow-up
recommended. Small stone within the inferior pole of the left kidney
measures 2-3 mm, image 41/4. The ureters appear patent. No
suspicious filling defects identified within the collecting systems
or ureters bilaterally. No suspicious scratch set no stone or
enhancing bladder lesions identified.

Stomach/Bowel: Stomach appears normal. The appendix is visualized
and appears within normal limits. No signs of bowel wall thickening,
inflammation, or distension.

Vascular/Lymphatic: Aortic atherosclerosis. No enlarged abdominal or
pelvic lymph nodes.

Reproductive: Prostate gland measures 5.2 by 4.3 by 4.6 cm (volume =
54 cm^3).

Other: No free fluid or fluid collections. Fat containing left
inguinal hernia is noted.

Musculoskeletal: Benign appearing bone island noted in the right
sacral wing, image 70/4. No acute or suspicious osseous findings.
IMPRESSION: 1. No acute findings within the abdomen or pelvis.
2. Nonobstructing left renal calculus.
3. Aortic Atherosclerosis (RIALS-BCJ.J).

## 2023-05-22 DIAGNOSIS — D1801 Hemangioma of skin and subcutaneous tissue: Secondary | ICD-10-CM | POA: Diagnosis not present

## 2023-05-22 DIAGNOSIS — Z08 Encounter for follow-up examination after completed treatment for malignant neoplasm: Secondary | ICD-10-CM | POA: Diagnosis not present

## 2023-05-22 DIAGNOSIS — L821 Other seborrheic keratosis: Secondary | ICD-10-CM | POA: Diagnosis not present

## 2023-05-22 DIAGNOSIS — Z85828 Personal history of other malignant neoplasm of skin: Secondary | ICD-10-CM | POA: Diagnosis not present

## 2023-05-22 DIAGNOSIS — L570 Actinic keratosis: Secondary | ICD-10-CM | POA: Diagnosis not present

## 2023-06-08 ENCOUNTER — Other Ambulatory Visit: Payer: Self-pay

## 2023-06-08 DIAGNOSIS — C61 Malignant neoplasm of prostate: Secondary | ICD-10-CM | POA: Diagnosis not present

## 2023-06-09 LAB — PSA: Prostate Specific Ag, Serum: 4.5 ng/mL — ABNORMAL HIGH (ref 0.0–4.0)

## 2023-06-13 ENCOUNTER — Ambulatory Visit: Payer: Self-pay | Admitting: Urology

## 2023-06-20 ENCOUNTER — Ambulatory Visit: Payer: Self-pay | Admitting: Nurse Practitioner

## 2023-06-20 ENCOUNTER — Encounter: Payer: Self-pay | Admitting: Nurse Practitioner

## 2023-06-20 VITALS — BP 134/80 | HR 79 | Temp 98.4°F | Resp 15 | Ht 72.01 in | Wt 240.4 lb

## 2023-06-20 DIAGNOSIS — Z1211 Encounter for screening for malignant neoplasm of colon: Secondary | ICD-10-CM | POA: Diagnosis not present

## 2023-06-20 DIAGNOSIS — I10 Essential (primary) hypertension: Secondary | ICD-10-CM | POA: Diagnosis not present

## 2023-06-20 DIAGNOSIS — Z136 Encounter for screening for cardiovascular disorders: Secondary | ICD-10-CM

## 2023-06-20 DIAGNOSIS — Z0000 Encounter for general adult medical examination without abnormal findings: Secondary | ICD-10-CM | POA: Diagnosis not present

## 2023-06-20 DIAGNOSIS — C61 Malignant neoplasm of prostate: Secondary | ICD-10-CM | POA: Insufficient documentation

## 2023-06-20 MED ORDER — OMEPRAZOLE 20 MG PO CPDR: 20.0000 mg | DELAYED_RELEASE_CAPSULE | Freq: Every day | ORAL | 1 refills | Status: DC

## 2023-06-20 MED ORDER — HYDROCHLOROTHIAZIDE 12.5 MG PO CAPS: 12.5000 mg | ORAL_CAPSULE | Freq: Every day | ORAL | 1 refills | Status: AC

## 2023-06-20 MED ORDER — CLOTRIMAZOLE-BETAMETHASONE 1-0.05 % EX CREA: 1.0000 | TOPICAL_CREAM | Freq: Every day | CUTANEOUS | Status: AC

## 2023-06-20 NOTE — Assessment & Plan Note (Signed)
 Followed by Urology.  Dr. Ace Holder has left [facility]- patient will transition to [facility].  Last notes reviewed from Urology appointment.

## 2023-06-20 NOTE — Progress Notes (Signed)
 BP 134/80 (BP Location: Left Arm, Patient Position: Sitting, Cuff Size: Large)   Pulse 79   Temp 98.4 F (36.9 C) (Oral)   Resp 15   Ht 6' 0.01" (1.829 m)   Wt 240 lb 6.4 oz (109 kg)   SpO2 97%   BMI 32.60 kg/m    Subjective:    Patient ID: Juan West, male    DOB: [birth year], 73 y.o.   MRN: [id number]  HPI: Juan West is a 73 y.o. male presenting on 06/20/2023 for comprehensive medical examination. Current medical complaints include:none  He currently lives with: Interim Problems from his last visit: no  HYPERTENSION without Chronic Kidney Disease Fatigue has improved.  Doing well with HCTZ.   Hypertension status: controlled Satisfied with current treatment? yes Duration of hypertension: years BP monitoring frequency: not checking BP range:  BP medication side effects:  no Medication compliance: excellent compliance Previous BP meds:HCTZ Aspirin: no Recurrent headaches: no Visual changes: no Palpitations: no Dyspnea: yes Chest pain: no Lower extremity edema: no Dizzy/lightheaded: no  Depression Screen done today and results listed below:     06/20/2023    8:11 AM 07/08/2020    8:35 AM  Depression screen PHQ 2/9  Decreased Interest 0 2  Down, Depressed, Hopeless 0 2  PHQ - 2 Score 0 4  Altered sleeping 0 3  Tired, decreased energy 0 3  Change in appetite 0 0  Feeling bad or failure about yourself  0 0  Trouble concentrating 0 1  Moving slowly or fidgety/restless 0 0  Suicidal thoughts 0 0  PHQ-9 Score 0 11  Difficult doing work/chores  Somewhat difficult    The patient does not have a history of falls. I did complete a risk assessment for falls. A plan of care for falls was documented.   Past Medical History:  Past Medical History:  Diagnosis Date   Allergy    Cancer (HCC)    GERD (gastroesophageal reflux disease)     Surgical History:  Past Surgical History:  Procedure Laterality Date   BASAL CELL CARCINOMA EXCISION      COLONOSCOPY WITH PROPOFOL  N/A 02/13/2022   Procedure: COLONOSCOPY WITH PROPOFOL ;  Surgeon: Selena Daily, MD;  Location: [facility] ENDOSCOPY;  Service: Gastroenterology;  Laterality: N/A;   FRACTURE SURGERY     HERNIA REPAIR     NO PAST SURGERIES     PTERYGIUM EXCISION     SQUAMOUS CELL CARCINOMA EXCISION      Medications:  Current Outpatient Medications on File Prior to Visit  Medication Sig   cetirizine  (ZYRTEC ) 10 MG tablet Take 1 tablet (10 mg total) by mouth daily.   No current facility-administered medications on file prior to visit.    Allergies:  Allergies  Allergen Reactions   Codeine Other (See Comments)    hallucinations   Melatonin Other (See Comments)    Drowsiness    Trazodone And Nefazodone Nausea And Vomiting    Social History:  Social History   Socioeconomic History   Marital status: Divorced    Spouse name: Not on file   Number of children: Not on file   Years of education: Not on file   Highest education level: Associate degree: academic program  Occupational History   Not on file  Tobacco Use   Smoking status: Never   Smokeless tobacco: Never  Vaping Use   Vaping status: Never Used  Substance and Sexual Activity   Alcohol use: Yes    Alcohol/week:  2.0 standard drinks of alcohol    Types: 2 Shots of liquor per week    Comment: on occasion   Drug use: Never   Sexual activity: Not Currently  Other Topics Concern   Not on file  Social History Narrative   Not on file   Social Drivers of Health   Financial Resource Strain: Patient Declined (06/16/2023)   Overall Financial Resource Strain (CARDIA)    Difficulty of Paying Living Expenses: Patient declined  Food Insecurity: No Food Insecurity (12/14/2022)   Hunger Vital Sign    Worried About Running Out of Food in the Last Year: Never true    Ran Out of Food in the Last Year: Never true  Transportation Needs: Patient Declined (06/16/2023)   PRAPARE - Transportation    Lack of Transportation  (Medical): Patient declined    Lack of Transportation (Non-Medical): Patient declined  Physical Activity: Unknown (06/16/2023)   Exercise Vital Sign    Days of Exercise per Week: Patient declined    Minutes of Exercise per Session: 20 min  Stress: No Stress Concern Present (06/20/2023)   [employer] of Occupational Health - Occupational Stress Questionnaire    Feeling of Stress : Not at all  Social Connections: Unknown (06/16/2023)   Social Connection and Isolation Panel [NHANES]    Frequency of Communication with Friends and Family: Patient declined    Frequency of Social Gatherings with Friends and Family: Patient declined    Attends Religious Services: Patient declined    Database administrator or Organizations: Patient declined    Attends Banker Meetings: Not on file    Marital Status: Patient declined  Intimate Partner Violence: Not At Risk (06/20/2023)   Humiliation, Afraid, Rape, and Kick questionnaire    Fear of Current or Ex-Partner: No    Emotionally Abused: No    Physically Abused: No    Sexually Abused: No   Social History   Tobacco Use  Smoking Status Never  Smokeless Tobacco Never   Social History   Substance and Sexual Activity  Alcohol Use Yes   Alcohol/week: 2.0 standard drinks of alcohol   Types: 2 Shots of liquor per week   Comment: on occasion    Family History:  Family History  Problem Relation Age of Onset   Stroke Mother    Cancer Father    Stroke Brother    Autism Granddaughter     Past medical history, surgical history, medications, allergies, family history and social history reviewed with patient today and changes made to appropriate areas of the chart.   Review of Systems  Eyes:  Negative for blurred vision and double vision.  Respiratory:  Positive for shortness of breath.   Cardiovascular:  Negative for chest pain, palpitations and leg swelling.  Neurological:  Negative for dizziness and headaches.   All other ROS  negative except what is listed above and in the HPI.      Objective:     BP 134/80 (BP Location: Left Arm, Patient Position: Sitting, Cuff Size: Large)   Pulse 79   Temp 98.4 F (36.9 C) (Oral)   Resp 15   Ht 6' 0.01" (1.829 m)   Wt 240 lb 6.4 oz (109 kg)   SpO2 97%   BMI 32.60 kg/m   Wt Readings from Last 3 Encounters:  06/20/23 240 lb 6.4 oz (109 kg)  03/22/23 242 lb 9.6 oz (110 kg)  12/18/22 238 lb 12.8 oz (108.3 kg)    Physical Exam  Vitals and nursing note reviewed.  Constitutional:      General: He is not in acute distress.    Appearance: Normal appearance. He is not ill-appearing, toxic-appearing or diaphoretic.  HENT:     Head: Normocephalic.     Right Ear: Ear canal and external ear normal. A middle ear effusion is present. There is no impacted cerumen. Tympanic membrane is erythematous. Tympanic membrane is not perforated or bulging.     Left Ear: Tympanic membrane, ear canal and external ear normal.     Nose: Nose normal. No congestion or rhinorrhea.     Mouth/Throat:     Mouth: Mucous membranes are moist.  Eyes:     General:        Right eye: No discharge.        Left eye: No discharge.     Extraocular Movements: Extraocular movements intact.     Conjunctiva/sclera: Conjunctivae normal.     Pupils: Pupils are equal, round, and reactive to light.  Cardiovascular:     Rate and Rhythm: Normal rate and regular rhythm.     Heart sounds: No murmur heard. Pulmonary:     Effort: Pulmonary effort is normal. No respiratory distress.     Breath sounds: Normal breath sounds. No wheezing, rhonchi or rales.  Abdominal:     General: Abdomen is flat. Bowel sounds are normal. There is no distension.     Palpations: Abdomen is soft.     Tenderness: There is no abdominal tenderness. There is no guarding.  Musculoskeletal:     Cervical back: Normal range of motion and neck supple.  Skin:    General: Skin is warm and dry.     Capillary Refill: Capillary refill takes less  than 2 seconds.  Neurological:     General: No focal deficit present.     Mental Status: He is alert and oriented to person, place, and time.     Cranial Nerves: No cranial nerve deficit.     Motor: No weakness.     Deep Tendon Reflexes: Reflexes normal.  Psychiatric:        Mood and Affect: Mood normal.        Behavior: Behavior normal.        Thought Content: Thought content normal.        Judgment: Judgment normal.     Results for orders placed or performed in visit on 06/08/23  PSA   Collection Time: 06/08/23 10:42 AM  Result Value Ref Range   Prostate Specific Ag, Serum 4.5 (H) 0.0 - 4.0 ng/mL      Assessment & Plan:   Problem List Items Addressed This Visit       Cardiovascular and Mediastinum   Primary hypertension   Chronic.  Controlled.  Continue with current medication regimen of hydrochlorothiazide  12.5mg  daily.  Refills sent today. Recommend checking blood pressures at home and bringing log to next visit.  Labs ordered today.  Return to clinic in 6 months for reevaluation.  Call sooner if concerns arise.        Relevant Medications   hydrochlorothiazide  (MICROZIDE ) 12.5 MG capsule     Genitourinary   Prostate cancer (HCC)   Followed by Urology.  Dr. Ace Holder has left [facility]- patient will transition to [facility].  Last notes reviewed from Urology appointment.      Other Visit Diagnoses       Encounter for Medicare annual wellness exam    -  Primary     Annual  physical exam       Health maintenance reviewed during visit today. Labs ordered.  Vaccines reviewed.  Colonoscopy up to date.  Followed by Urology for PSA.   Relevant Orders   TSH   Lipid panel   CBC with Differential/Platelet   Comprehensive metabolic panel with GFR     Screening for ischemic heart disease       Relevant Orders   Lipid panel     Screening for colon cancer            Discussed aspirin prophylaxis for myocardial infarction prevention and decision was it was not  indicated  LABORATORY TESTING:  Health maintenance labs ordered today as discussed above.   The natural history of prostate cancer and ongoing controversy regarding screening and potential treatment outcomes of prostate cancer has been discussed with the patient. The meaning of a false positive PSA and a false negative PSA has been discussed. He indicates understanding of the limitations of this screening test and wishes to proceed with screening PSA testing.   IMMUNIZATIONS:   - Tdap: Tetanus vaccination status reviewed: last tetanus booster within 10 years. - Influenza: Postponed to flu season - Pneumovax: Up to date - Prevnar: Up to date - COVID: Not applicable - HPV: Not applicable - Shingrix vaccine: Discussed at visit today  SCREENING: - Colonoscopy: Up to date  Discussed with patient purpose of the colonoscopy is to detect colon cancer at curable precancerous or early stages   - AAA Screening: Not applicable  -Hearing Test: Not applicable  -Spirometry: Not applicable   PATIENT COUNSELING:    Sexuality: Discussed sexually transmitted diseases, partner selection, use of condoms, avoidance of unintended pregnancy  and contraceptive alternatives.   Advised to avoid cigarette smoking.  I discussed with the patient that most people either abstain from alcohol or drink within safe limits (<=14/week and <=4 drinks/occasion for males, <=7/weeks and <= 3 drinks/occasion for females) and that the risk for alcohol disorders and other health effects rises proportionally with the number of drinks per week and how often a drinker exceeds daily limits.  Discussed cessation/primary prevention of drug use and availability of treatment for abuse.   Diet: Encouraged to adjust caloric intake to maintain  or achieve ideal body weight, to reduce intake of dietary saturated fat and total fat, to limit sodium intake by avoiding high sodium foods and not adding table salt, and to maintain adequate  dietary potassium and calcium preferably from fresh fruits, vegetables, and low-fat dairy products.    stressed the importance of regular exercise  Injury prevention: Discussed safety belts, safety helmets, smoke detector, smoking near bedding or upholstery.   Dental health: Discussed importance of regular tooth brushing, flossing, and dental visits.   Follow up plan: NEXT PREVENTATIVE PHYSICAL DUE IN 1 YEAR. Return in 6 months (on 12/21/2023).

## 2023-06-20 NOTE — Assessment & Plan Note (Signed)
 Chronic.  Controlled.  Continue with current medication regimen of hydrochlorothiazide  12.5mg  daily.  Refills sent today. Recommend checking blood pressures at home and bringing log to next visit.  Labs ordered today.  Return to clinic in 6 months for reevaluation.  Call sooner if concerns arise.

## 2023-06-20 NOTE — Progress Notes (Signed)
 Subjective:   Juan West is a 73 y.o. male who presents for Medicare Annual/Subsequent preventive examination.  Visit Complete: In person  Patient Medicare AWV questionnaire was completed by the patient on 06/20/23; I have confirmed that all information answered by patient is correct and no changes since this date.  Cardiac Risk Factors include: advanced age (>38men, >73 women);male gender;hypertension;obesity (BMI >30kg/m2)     Objective:     Today's Vitals   06/20/23 0809 06/20/23 0812  BP: 134/80   Pulse: 79   Resp: 15   Temp: 98.4 F (36.9 C)   TempSrc: Oral   SpO2: 97%   Weight: 240 lb 6.4 oz (109 kg)   Height: 6' 0.01" (1.829 m)   PainSc: 0-No pain 0-No pain   Body mass index is 32.6 kg/m.     06/20/2023    8:15 AM 02/13/2022    8:07 AM  Advanced Directives  Does Patient Have a Medical Advance Directive? No;Yes Yes  Type of Estate agent of [place];Out of facility DNR (pink MOST or yellow form) Healthcare Power of [place];Living will  Does patient want to make changes to medical advance directive? Yes (MAU/Ambulatory/Procedural Areas - Information given)   Copy of Healthcare Power of Attorney in Chart? No - copy requested   Would patient like information on creating a medical advance directive? No - Patient declined     Current Medications (verified) Outpatient Encounter Medications as of 06/20/2023  Medication Sig   cetirizine  (ZYRTEC ) 10 MG tablet Take 1 tablet (10 mg total) by mouth daily.   [DISCONTINUED] hydrochlorothiazide  (MICROZIDE ) 12.5 MG capsule Take 1 capsule (12.5 mg total) by mouth daily.   [DISCONTINUED] omeprazole  (PRILOSEC) 20 MG capsule Take 1 capsule (20 mg total) by mouth daily.   hydrochlorothiazide  (MICROZIDE ) 12.5 MG capsule Take 1 capsule (12.5 mg total) by mouth daily.   omeprazole  (PRILOSEC) 20 MG capsule Take 1 capsule (20 mg total) by mouth daily.   No facility-administered encounter medications on  file as of 06/20/2023.    Allergies (verified) Codeine, Melatonin, and Trazodone and nefazodone   History: Past Medical History:  Diagnosis Date   Allergy    Cancer (HCC)    GERD (gastroesophageal reflux disease)    Past Surgical History:  Procedure Laterality Date   BASAL CELL CARCINOMA EXCISION     COLONOSCOPY WITH PROPOFOL  N/A 02/13/2022   Procedure: COLONOSCOPY WITH PROPOFOL ;  Surgeon: Selena Daily, MD;  Location: [facility] ENDOSCOPY;  Service: Gastroenterology;  Laterality: N/A;   FRACTURE SURGERY     HERNIA REPAIR     NO PAST SURGERIES     PTERYGIUM EXCISION     SQUAMOUS CELL CARCINOMA EXCISION     Family History  Problem Relation Age of Onset   Stroke Mother    Cancer Father    Stroke Brother    Autism Granddaughter    Social History   Socioeconomic History   Marital status: Divorced    Spouse name: Not on file   Number of children: Not on file   Years of education: Not on file   Highest education level: Associate degree: academic program  Occupational History   Not on file  Tobacco Use   Smoking status: Never   Smokeless tobacco: Never  Vaping Use   Vaping status: Never Used  Substance and Sexual Activity   Alcohol use: Yes    Alcohol/week: 2.0 standard drinks of alcohol    Types: 2 Shots of liquor per week  Comment: on occasion   Drug use: Never   Sexual activity: Not Currently  Other Topics Concern   Not on file  Social History Narrative   Not on file   Social Drivers of Health   Financial Resource Strain: Patient Declined (06/16/2023)   Overall Financial Resource Strain (CARDIA)    Difficulty of Paying Living Expenses: Patient declined  Food Insecurity: No Food Insecurity (12/14/2022)   Hunger Vital Sign    Worried About Running Out of Food in the Last Year: Never true    Ran Out of Food in the Last Year: Never true  Transportation Needs: Patient Declined (06/16/2023)   PRAPARE - Transportation    Lack of Transportation (Medical):  Patient declined    Lack of Transportation (Non-Medical): Patient declined  Physical Activity: Unknown (06/16/2023)   Exercise Vital Sign    Days of Exercise per Week: Patient declined    Minutes of Exercise per Session: 20 min  Stress: No Stress Concern Present (06/20/2023)   [employer] of Occupational Health - Occupational Stress Questionnaire    Feeling of Stress : Not at all  Social Connections: Unknown (06/16/2023)   Social Connection and Isolation Panel [NHANES]    Frequency of Communication with Friends and Family: Patient declined    Frequency of Social Gatherings with Friends and Family: Patient declined    Attends Religious Services: Patient declined    Database administrator or Organizations: Patient declined    Attends Engineer, structural: Not on file    Marital Status: Patient declined    Tobacco Counseling Counseling given: Not Answered   Clinical Intake:  Pre-visit preparation completed: No  Pain : No/denies pain Pain Score: 0-No pain     BMI - recorded: 32.6 Nutritional Status: BMI > 30  Obese Nutritional Risks: None Diabetes: No  How often do you need to have someone help you when you read instructions, pamphlets, or other written materials from your doctor or pharmacy?: 1 - Never  Interpreter Needed?: No      Activities of Daily Living    06/20/2023    8:12 AM  In your present state of health, do you have any difficulty performing the following activities:  Hearing? 0  Vision? 0  Difficulty concentrating or making decisions? 0  Walking or climbing stairs? 0  Dressing or bathing? 0  Doing errands, shopping? 0  Preparing Food and eating ? N  Using the Toilet? N  In the past six months, have you accidently leaked urine? N  Do you have problems with loss of bowel control? N  Managing your Medications? N  Managing your Finances? N  Housekeeping or managing your Housekeeping? N    Patient Care Team: Aileen Alexanders, NP as PCP  - General (Nurse Practitioner) Tobb, Kardie, DO as PCP - Cardiology (Cardiology)  Indicate any recent Medical Services you may have received from other than [facility] providers in the past year (date may be approximate).     Assessment:    This is a routine wellness examination for Juan West.  Hearing/Vision screen No results found.   Goals Addressed               This Visit's Progress     Weight (lb) < 225 lb (102.1 kg) (pt-stated)   240 lb 6.4 oz (109 kg)      Depression Screen    06/20/2023    8:11 AM 03/22/2023    8:38 AM 09/21/2022    9:31 AM 06/15/2022  8:10 AM 05/17/2022    8:10 AM 04/04/2022    4:05 PM 02/02/2022    8:12 AM  PHQ 2/9 Scores  PHQ - 2 Score 0        PHQ- 9 Score 0        Exception Documentation  Patient refusal Patient refusal Patient refusal Patient refusal Patient refusal Patient refusal    Fall Risk    06/20/2023    8:11 AM 05/17/2022    8:11 AM 02/02/2022    8:11 AM 08/01/2021    3:01 PM 11/25/2020    9:24 AM  Fall Risk   Falls in the past year? 0 0 0 0 0  Number falls in past yr: 0 0 0 0 0  Injury with Fall? 0 0 0 0 0  Risk for fall due to : No Fall Risks No Fall Risks No Fall Risks No Fall Risks   Follow up Falls evaluation completed Falls evaluation completed Falls evaluation completed Falls evaluation completed     MEDICARE RISK AT HOME: Medicare Risk at Home Any stairs in or around the home?: Yes If so, are there any without handrails?: Yes Home free of loose throw rugs in walkways, pet beds, electrical cords, etc?: Yes Adequate lighting in your home to reduce risk of falls?: Yes Life alert?: No Use of a cane, walker or w/c?: No Grab bars in the bathroom?: No Shower chair or bench in shower?: No Elevated toilet seat or a handicapped toilet?: No  TIMED UP AND GO:  Was the test performed?  Yes  Length of time to ambulate 10 feet: 8 sec Gait steady and fast without use of assistive device    Cognitive Function:        06/20/2023     8:17 AM  6CIT Screen  What Year? 0 points  What month? 0 points  What time? 0 points  Count back from 20 0 points  Months in reverse 0 points  Repeat phrase 2 points  Total Score 2 points    Immunizations Immunization History  Administered Date(s) Administered   Fluad Quad(high Dose 65+) 10/14/2020   Influenza,inj,Quad PF,6+ Mos 11/26/2012, 11/07/2013, 01/11/2017, 10/10/2017, 11/26/2018, 10/15/2019   Moderna Sars-Covid-2 Vaccination 02/10/2019, 03/12/2019   Pneumococcal Conjugate-13 02/09/2017   Pneumococcal Polysaccharide-23 04/04/2018   Td 11/25/2020   Zoster, Live 11/26/2012    TDAP status: Up to date  Flu Vaccine status: Up to date  Pneumococcal vaccine status: Up to date  Covid-19 vaccine status: Completed vaccines  Qualifies for Shingles Vaccine? Yes   Zostavax completed No   Shingrix Completed?: No.    Education has been provided regarding the importance of this vaccine. Patient has been advised to call insurance company to determine out of pocket expense if they have not yet received this vaccine. Advised may also receive vaccine at local pharmacy or Health Dept. Verbalized acceptance and understanding.  Screening Tests Health Maintenance  Topic Date Due   Zoster Vaccines- Shingrix (1 of 2) 08/31/1969   Medicare Annual Wellness (AWV)  02/03/2023   COVID-19 Vaccine (3 - Moderna risk series) 07/06/2023 (Originally 04/09/2019)   INFLUENZA VACCINE  08/24/2023   Colonoscopy  02/13/2025   DTaP/Tdap/Td (2 - Tdap) 11/26/2030   Pneumonia Vaccine 74+ Years old  Completed   Hepatitis C Screening  Completed   HPV VACCINES  Aged Out   Meningococcal B Vaccine  Aged Out    Health Maintenance  Health Maintenance Due  Topic Date Due   Zoster Vaccines- Shingrix (  1 of 2) 08/31/1969   Medicare Annual Wellness (AWV)  02/03/2023    Colorectal cancer screening: Type of screening: Colonoscopy. Completed 02/13/2022. Repeat every 3 years  Lung Cancer Screening: (Low Dose  CT Chest recommended if Age 90-80 years, 20 pack-year currently smoking OR have quit w/in 15years.) does not qualify.   Additional Screening:  Hepatitis C Screening: does qualify; Completed 02/02/2022  Vision Screening: Recommended annual ophthalmology exams for early detection of glaucoma and other disorders of the eye. Is the patient up to date with their annual eye exam?  Yes   Dental Screening: Recommended annual dental exams for proper oral hygiene  Diabetic Foot Exam: N/A  Community Resource Referral / Chronic Care Management: CRR required this visit?  No   CCM required this visit?  No     Plan:     I have personally reviewed and noted the following in the patient's chart:   Medical and social history Use of alcohol, tobacco or illicit drugs  Current medications and supplements including opioid prescriptions. Patient is not currently taking opioid prescriptions. Functional ability and status Nutritional status Physical activity Advanced directives List of other physicians Hospitalizations, surgeries, and ER visits in previous 12 months Vitals Screenings to include cognitive, depression, and falls Referrals and appointments  In addition, I have reviewed and discussed with patient certain preventive protocols, quality metrics, and best practice recommendations. A written personalized care plan for preventive services as well as general preventive health recommendations were provided to patient.     Jilda Most Dessirae Scarola, CMA   06/20/2023   After Visit Summary: (In Person-Printed) AVS printed and given to the patient  Nurse Notes:

## 2023-06-20 NOTE — Progress Notes (Signed)
 Appt scheduled

## 2023-06-21 ENCOUNTER — Ambulatory Visit: Payer: Self-pay | Admitting: Nurse Practitioner

## 2023-06-21 LAB — CBC WITH DIFFERENTIAL/PLATELET
Basophils Absolute: 0.1 10*3/uL (ref 0.0–0.2)
Basos: 1 %
EOS (ABSOLUTE): 0.2 10*3/uL (ref 0.0–0.4)
Eos: 4 %
Hematocrit: 51.9 % — ABNORMAL HIGH (ref 37.5–51.0)
Hemoglobin: 17.4 g/dL (ref 13.0–17.7)
Immature Grans (Abs): 0 10*3/uL (ref 0.0–0.1)
Immature Granulocytes: 0 %
Lymphocytes Absolute: 1.4 10*3/uL (ref 0.7–3.1)
Lymphs: 24 %
MCH: 31.5 pg (ref 26.6–33.0)
MCHC: 33.5 g/dL (ref 31.5–35.7)
MCV: 94 fL (ref 79–97)
Monocytes Absolute: 0.6 10*3/uL (ref 0.1–0.9)
Monocytes: 10 %
Neutrophils Absolute: 3.4 10*3/uL (ref 1.4–7.0)
Neutrophils: 61 %
Platelets: 241 10*3/uL (ref 150–450)
RBC: 5.52 x10E6/uL (ref 4.14–5.80)
RDW: 13 % (ref 11.6–15.4)
WBC: 5.6 10*3/uL (ref 3.4–10.8)

## 2023-06-21 LAB — COMPREHENSIVE METABOLIC PANEL WITH GFR
ALT: 29 IU/L (ref 0–44)
AST: 26 IU/L (ref 0–40)
Albumin: 4.7 g/dL (ref 3.8–4.8)
Alkaline Phosphatase: 75 IU/L (ref 44–121)
BUN/Creatinine Ratio: 14 (ref 10–24)
BUN: 17 mg/dL (ref 8–27)
Bilirubin Total: 0.6 mg/dL (ref 0.0–1.2)
CO2: 20 mmol/L (ref 20–29)
Calcium: 9.7 mg/dL (ref 8.6–10.2)
Chloride: 100 mmol/L (ref 96–106)
Creatinine, Ser: 1.21 mg/dL (ref 0.76–1.27)
Globulin, Total: 2.3 g/dL (ref 1.5–4.5)
Glucose: 94 mg/dL (ref 70–99)
Potassium: 4.5 mmol/L (ref 3.5–5.2)
Sodium: 139 mmol/L (ref 134–144)
Total Protein: 7 g/dL (ref 6.0–8.5)
eGFR: 63 mL/min/{1.73_m2} (ref >59)

## 2023-06-21 LAB — LIPID PANEL
Chol/HDL Ratio: 3.8 ratio (ref 0.0–5.0)
Cholesterol, Total: 129 mg/dL (ref 100–199)
HDL: 34 mg/dL — ABNORMAL LOW (ref >39)
LDL Chol Calc (NIH): 79 mg/dL (ref 0–99)
Triglycerides: 82 mg/dL (ref 0–149)
VLDL Cholesterol Cal: 16 mg/dL (ref 5–40)

## 2023-06-21 LAB — TSH: TSH: 2.07 u[IU]/mL (ref 0.450–4.500)

## 2023-07-09 DIAGNOSIS — C61 Malignant neoplasm of prostate: Secondary | ICD-10-CM | POA: Diagnosis not present

## 2023-08-06 ENCOUNTER — Other Ambulatory Visit: Payer: Self-pay

## 2023-08-14 ENCOUNTER — Ambulatory Visit: Payer: Self-pay | Admitting: Urology

## 2023-10-09 DIAGNOSIS — H60333 Swimmer's ear, bilateral: Secondary | ICD-10-CM | POA: Diagnosis not present

## 2023-10-09 DIAGNOSIS — H9203 Otalgia, bilateral: Secondary | ICD-10-CM | POA: Diagnosis not present

## 2023-10-23 DIAGNOSIS — H60333 Swimmer's ear, bilateral: Secondary | ICD-10-CM | POA: Diagnosis not present

## 2023-10-23 DIAGNOSIS — H6063 Unspecified chronic otitis externa, bilateral: Secondary | ICD-10-CM | POA: Diagnosis not present

## 2023-11-12 DIAGNOSIS — I10 Essential (primary) hypertension: Secondary | ICD-10-CM | POA: Diagnosis not present

## 2023-11-12 DIAGNOSIS — Z0000 Encounter for general adult medical examination without abnormal findings: Secondary | ICD-10-CM | POA: Diagnosis not present

## 2023-11-21 DIAGNOSIS — D2261 Melanocytic nevi of right upper limb, including shoulder: Secondary | ICD-10-CM | POA: Diagnosis not present

## 2023-11-21 DIAGNOSIS — L820 Inflamed seborrheic keratosis: Secondary | ICD-10-CM | POA: Diagnosis not present

## 2023-11-21 DIAGNOSIS — D225 Melanocytic nevi of trunk: Secondary | ICD-10-CM | POA: Diagnosis not present

## 2023-11-21 DIAGNOSIS — D485 Neoplasm of uncertain behavior of skin: Secondary | ICD-10-CM | POA: Diagnosis not present

## 2023-11-21 DIAGNOSIS — L821 Other seborrheic keratosis: Secondary | ICD-10-CM | POA: Diagnosis not present

## 2023-11-21 DIAGNOSIS — L905 Scar conditions and fibrosis of skin: Secondary | ICD-10-CM | POA: Diagnosis not present

## 2023-11-21 DIAGNOSIS — D2262 Melanocytic nevi of left upper limb, including shoulder: Secondary | ICD-10-CM | POA: Diagnosis not present

## 2023-11-21 DIAGNOSIS — D2272 Melanocytic nevi of left lower limb, including hip: Secondary | ICD-10-CM | POA: Diagnosis not present

## 2023-11-21 DIAGNOSIS — L570 Actinic keratosis: Secondary | ICD-10-CM | POA: Diagnosis not present

## 2023-11-23 DIAGNOSIS — H2513 Age-related nuclear cataract, bilateral: Secondary | ICD-10-CM | POA: Diagnosis not present

## 2023-11-23 DIAGNOSIS — H179 Unspecified corneal scar and opacity: Secondary | ICD-10-CM | POA: Diagnosis not present

## 2023-11-23 DIAGNOSIS — Z9889 Other specified postprocedural states: Secondary | ICD-10-CM | POA: Diagnosis not present

## 2023-11-23 DIAGNOSIS — H43813 Vitreous degeneration, bilateral: Secondary | ICD-10-CM | POA: Diagnosis not present

## 2023-12-14 ENCOUNTER — Other Ambulatory Visit: Payer: Self-pay | Admitting: Nurse Practitioner

## 2023-12-15 NOTE — Telephone Encounter (Signed)
 Requested Prescriptions  Pending Prescriptions Disp Refills   omeprazole  (PRILOSEC) 20 MG capsule [Pharmacy Med Name: OMEPRAZOLE  DR 20 MG CAPSULE] 90 capsule 1    Sig: TAKE 1 CAPSULE BY MOUTH DAILY     Gastroenterology: Proton Pump Inhibitors Passed - 12/15/2023  4:19 PM      Passed - Valid encounter within last 12 months    Recent Outpatient Visits           5 months ago Encounter for Medicare annual wellness exam   [place] [facility] Melvin Pao, NP   8 months ago Acute otitis externa of both ears, unspecified type   [facility] Herold Hadassah SQUIBB, MD       Future Appointments             In 6 months Melvin Pao, NP [place] [facility], [address]

## 2023-12-25 ENCOUNTER — Ambulatory Visit: Admitting: Nurse Practitioner

## 2024-01-07 DIAGNOSIS — C61 Malignant neoplasm of prostate: Secondary | ICD-10-CM | POA: Diagnosis not present

## 2024-01-11 DIAGNOSIS — N401 Enlarged prostate with lower urinary tract symptoms: Secondary | ICD-10-CM | POA: Diagnosis not present

## 2024-01-11 DIAGNOSIS — C61 Malignant neoplasm of prostate: Secondary | ICD-10-CM | POA: Diagnosis not present

## 2024-01-11 DIAGNOSIS — R3912 Poor urinary stream: Secondary | ICD-10-CM | POA: Diagnosis not present

## 2024-01-11 DIAGNOSIS — Z23 Encounter for immunization: Secondary | ICD-10-CM | POA: Diagnosis not present

## 2024-06-20 ENCOUNTER — Encounter: Admitting: Nurse Practitioner
